# Patient Record
Sex: Female | Born: 1970
Health system: Southern US, Community
[De-identification: ages and names within clinical notes are randomized; demographics above are authoritative.]

## PROBLEM LIST (undated history)

## (undated) DIAGNOSIS — R197 Diarrhea, unspecified: Secondary | ICD-10-CM

## (undated) DIAGNOSIS — R109 Unspecified abdominal pain: Secondary | ICD-10-CM

## (undated) DIAGNOSIS — F329 Major depressive disorder, single episode, unspecified: Secondary | ICD-10-CM

## (undated) DIAGNOSIS — J309 Allergic rhinitis, unspecified: Secondary | ICD-10-CM

## (undated) DIAGNOSIS — R599 Enlarged lymph nodes, unspecified: Secondary | ICD-10-CM

## (undated) DIAGNOSIS — K921 Melena: Secondary | ICD-10-CM

## (undated) DIAGNOSIS — F419 Anxiety disorder, unspecified: Secondary | ICD-10-CM

## (undated) DIAGNOSIS — K644 Residual hemorrhoidal skin tags: Secondary | ICD-10-CM

## (undated) DIAGNOSIS — E785 Hyperlipidemia, unspecified: Secondary | ICD-10-CM

## (undated) DIAGNOSIS — G4700 Insomnia, unspecified: Secondary | ICD-10-CM

## (undated) DIAGNOSIS — T7840XA Allergy, unspecified, initial encounter: Secondary | ICD-10-CM

## (undated) DIAGNOSIS — K649 Unspecified hemorrhoids: Secondary | ICD-10-CM

## (undated) HISTORY — DX: Major depressive disorder, single episode, unspecified: F32.9

## (undated) HISTORY — DX: Melena: K92.1

## (undated) HISTORY — DX: Hyperlipidemia, unspecified: E78.5

## (undated) HISTORY — DX: Insomnia, unspecified: G47.00

## (undated) HISTORY — DX: Enlarged lymph nodes, unspecified: R59.9

## (undated) HISTORY — DX: Diarrhea, unspecified: R19.7

## (undated) HISTORY — DX: Unspecified abdominal pain: R10.9

## (undated) HISTORY — DX: Anxiety disorder, unspecified: F41.9

## (undated) HISTORY — DX: Residual hemorrhoidal skin tags: K64.4

## (undated) HISTORY — DX: Allergic rhinitis, unspecified: J30.9

## (undated) HISTORY — PX: LEEP: SHX91

## (undated) HISTORY — DX: Unspecified hemorrhoids: K64.9

## (undated) HISTORY — DX: Allergy, unspecified, initial encounter: T78.40XA

---

## 2006-08-14 ENCOUNTER — Other Ambulatory Visit: Admission: RE | Admit: 2006-08-14 | Discharge: 2006-08-14 | Payer: Self-pay | Admitting: Gynecology

## 2006-08-21 ENCOUNTER — Ambulatory Visit: Payer: Self-pay | Admitting: Internal Medicine

## 2006-08-21 DIAGNOSIS — F3289 Other specified depressive episodes: Secondary | ICD-10-CM

## 2006-08-21 DIAGNOSIS — F339 Major depressive disorder, recurrent, unspecified: Secondary | ICD-10-CM | POA: Insufficient documentation

## 2006-08-21 DIAGNOSIS — J309 Allergic rhinitis, unspecified: Secondary | ICD-10-CM

## 2006-08-21 DIAGNOSIS — F329 Major depressive disorder, single episode, unspecified: Secondary | ICD-10-CM

## 2006-08-21 HISTORY — DX: Other specified depressive episodes: F32.89

## 2006-08-21 HISTORY — DX: Allergic rhinitis, unspecified: J30.9

## 2006-08-21 HISTORY — DX: Major depressive disorder, single episode, unspecified: F32.9

## 2006-10-07 DIAGNOSIS — O039 Complete or unspecified spontaneous abortion without complication: Secondary | ICD-10-CM | POA: Insufficient documentation

## 2007-11-16 ENCOUNTER — Inpatient Hospital Stay (HOSPITAL_COMMUNITY): Admission: RE | Admit: 2007-11-16 | Discharge: 2007-11-18 | Payer: Self-pay | Admitting: Obstetrics and Gynecology

## 2008-04-29 ENCOUNTER — Telehealth: Payer: Self-pay | Admitting: Internal Medicine

## 2008-05-05 ENCOUNTER — Ambulatory Visit: Payer: Self-pay | Admitting: Internal Medicine

## 2008-05-05 DIAGNOSIS — R109 Unspecified abdominal pain: Secondary | ICD-10-CM

## 2008-05-05 DIAGNOSIS — R1084 Generalized abdominal pain: Secondary | ICD-10-CM | POA: Insufficient documentation

## 2008-05-05 HISTORY — DX: Unspecified abdominal pain: R10.9

## 2009-03-23 ENCOUNTER — Ambulatory Visit: Payer: Self-pay | Admitting: Gastroenterology

## 2009-03-23 DIAGNOSIS — K644 Residual hemorrhoidal skin tags: Secondary | ICD-10-CM

## 2009-03-23 DIAGNOSIS — R197 Diarrhea, unspecified: Secondary | ICD-10-CM

## 2009-03-23 DIAGNOSIS — K649 Unspecified hemorrhoids: Secondary | ICD-10-CM | POA: Insufficient documentation

## 2009-03-23 DIAGNOSIS — R109 Unspecified abdominal pain: Secondary | ICD-10-CM

## 2009-03-23 HISTORY — DX: Diarrhea, unspecified: R19.7

## 2009-03-23 HISTORY — DX: Unspecified hemorrhoids: K64.9

## 2009-03-23 HISTORY — DX: Residual hemorrhoidal skin tags: K64.4

## 2009-03-23 HISTORY — DX: Unspecified abdominal pain: R10.9

## 2009-03-28 ENCOUNTER — Telehealth: Payer: Self-pay | Admitting: Gastroenterology

## 2009-03-28 ENCOUNTER — Encounter: Payer: Self-pay | Admitting: Gastroenterology

## 2009-03-28 LAB — CONVERTED CEMR LAB
AST: 17 units/L (ref 0–37)
Alkaline Phosphatase: 66 units/L (ref 39–117)
BUN: 15 mg/dL (ref 6–23)
Basophils Relative: 0.4 % (ref 0.0–3.0)
Eosinophils Relative: 0.7 % (ref 0.0–5.0)
Glucose, Bld: 82 mg/dL (ref 70–99)
HCT: 35.7 % — ABNORMAL LOW (ref 36.0–46.0)
Lymphs Abs: 1 10*3/uL (ref 0.7–4.0)
MCHC: 34.5 g/dL (ref 30.0–36.0)
MCV: 87.1 fL (ref 78.0–100.0)
Monocytes Absolute: 0.3 10*3/uL (ref 0.1–1.0)
RBC: 4.1 M/uL (ref 3.87–5.11)
Sodium: 140 meq/L (ref 135–145)
Total Bilirubin: 0.7 mg/dL (ref 0.3–1.2)
Total Protein: 7 g/dL (ref 6.0–8.3)
WBC: 4.9 10*3/uL (ref 4.5–10.5)

## 2009-03-30 ENCOUNTER — Encounter: Payer: Self-pay | Admitting: Gastroenterology

## 2009-04-03 ENCOUNTER — Encounter (INDEPENDENT_AMBULATORY_CARE_PROVIDER_SITE_OTHER): Payer: Self-pay

## 2009-06-08 ENCOUNTER — Ambulatory Visit: Payer: Self-pay | Admitting: Family Medicine

## 2009-06-08 DIAGNOSIS — R599 Enlarged lymph nodes, unspecified: Secondary | ICD-10-CM

## 2009-06-08 HISTORY — DX: Enlarged lymph nodes, unspecified: R59.9

## 2009-10-27 ENCOUNTER — Telehealth: Payer: Self-pay | Admitting: Internal Medicine

## 2009-10-31 ENCOUNTER — Encounter: Payer: Self-pay | Admitting: Internal Medicine

## 2010-05-10 ENCOUNTER — Ambulatory Visit: Payer: Self-pay | Admitting: Gastroenterology

## 2010-05-10 DIAGNOSIS — K921 Melena: Secondary | ICD-10-CM | POA: Insufficient documentation

## 2010-05-10 HISTORY — DX: Melena: K92.1

## 2010-06-14 ENCOUNTER — Ambulatory Visit: Payer: Self-pay | Admitting: Gastroenterology

## 2010-06-18 ENCOUNTER — Encounter: Payer: Self-pay | Admitting: Gastroenterology

## 2010-07-26 ENCOUNTER — Ambulatory Visit: Payer: Self-pay | Admitting: Family Medicine

## 2010-07-26 DIAGNOSIS — G4700 Insomnia, unspecified: Secondary | ICD-10-CM | POA: Insufficient documentation

## 2010-07-26 HISTORY — DX: Insomnia, unspecified: G47.00

## 2010-11-06 NOTE — Assessment & Plan Note (Signed)
Summary: med check re: allergy/cjr   Vital Signs:  Patient profile:   40 year old female Weight:      139 pounds Temp:     99.2 degrees F oral BP sitting:   110 / 80  (left arm) Cuff size:   regular  Vitals Entered By: Sid Falcon LPN (July 26, 2010 4:39 PM)  History of Present Illness: Pt here requesting med refills.  Takes Flonase for perennial allergies, ?mold and dust. Controls allergies fairly well.  No side effects.  Has had better response with  nasals vs oral antihistamines.  Hx transient insomnia.  No response with benadryl.  Requests refills  Ambien which she takes infrequently.  no late day caffeine use.  Allergies: 1)  ! Codeine  Past History:  Past Medical History: Last updated: 05/10/2010 Depression Anxiety Disorder Irritable Bowel Syndrome Cholelithiasis PMH reviewed for relevance  Review of Systems  The patient denies anorexia, fever, weight loss, hoarseness, prolonged cough, and headaches.    Physical Exam  General:  Well-developed,well-nourished,in no acute distress; alert,appropriate and cooperative throughout examination Ears:  External ear exam shows no significant lesions or deformities.  Otoscopic examination reveals clear canals, tympanic membranes are intact bilaterally without bulging, retraction, inflammation or discharge. Hearing is grossly normal bilaterally. Nose:  External nasal examination shows no deformity or inflammation. Nasal mucosa are pink and moist without lesions or exudates. Mouth:  Oral mucosa and oropharynx without lesions or exudates.  Teeth in good repair. Neck:  No deformities, masses, or tenderness noted. Lungs:  Normal respiratory effort, chest expands symmetrically. Lungs are clear to auscultation, no crackles or wheezes. Heart:  Normal rate and regular rhythm. S1 and S2 normal without gallop, murmur, click, rub or other extra sounds.   Impression & Recommendations:  Problem # 1:  ALLERGIC  RHINITIS  (ICD-477.9)  Her updated medication list for this problem includes:    Zyrtec Allergy 10 Mg Tabs (Cetirizine hcl) .Marland Kitchen... As needed    Flonase 50 Mcg/act Susp (Fluticasone propionate) .Marland Kitchen... 2 sprays to each nostril daily  Problem # 2:  INSOMNIA, TRANSIENT (ICD-780.52)  Her updated medication list for this problem includes:    Zolpidem Tartrate 10 Mg Tabs (Zolpidem tartrate) ..... One by mouth at bedtime as needed  Complete Medication List: 1)  Zoloft 100 Mg Tabs (Sertraline hcl) .... Take one tab by mouth once daily 2)  Zyrtec Allergy 10 Mg Tabs (Cetirizine hcl) .... As needed 3)  Zolpidem Tartrate 10 Mg Tabs (Zolpidem tartrate) .... One by mouth at bedtime as needed 4)  Flonase 50 Mcg/act Susp (Fluticasone propionate) .... 2 sprays to each nostril daily 5)  Levsin 0.125 Mg Tabs (Hyoscyamine sulfate) .Marland Kitchen.. 1-2 tablets by mouth four times a day as needed Prescriptions: ZOLPIDEM TARTRATE 10 MG TABS (ZOLPIDEM TARTRATE) one by mouth at bedtime as needed  #30 x 1   Entered and Authorized by:   Evelena Peat MD   Signed by:   Evelena Peat MD on 07/26/2010   Method used:   Print then Give to Patient   RxID:   [id number] FLONASE 50 MCG/ACT SUSP (FLUTICASONE PROPIONATE) 2 sprays to each nostril daily  #1 x 11   Entered and Authorized by:   Evelena Peat MD   Signed by:   Evelena Peat MD on 07/26/2010   Method used:   Electronically to        [facility] [address] (retail)       [address]  [place], [place]  [address]       Ph: [phone]       Fax: [phone]   RxID:   [id number]    Orders Added: 1)  Est. Patient Level III [84132]

## 2010-11-06 NOTE — Assessment & Plan Note (Signed)
Summary: ABD PAIN/YF   History of Present Illness Visit Type: Follow-up Visit Primary GI MD: Elie Goody MD [facility] Primary Javion Holmer: Leeanne Deed Requesting Reniya Mcclees: Shaune Spittle Chief Complaint: c/o generalized abdominal pain and 3-4 loose stools per day History of Present Illness:   This is a return office visit for persistent problems with intermittent diarrhea, small-volume hematochezia and mild generalized abdominal pain. Her symptoms have not substantially change from last year, but they persist. She occasionally has nausea and has recently noted some mild postprandial epigastric pain.   GI Review of Systems    Reports abdominal pain and  nausea.     Location of  Abdominal pain: generalized.    Denies acid reflux, belching, bloating, chest pain, dysphagia with liquids, dysphagia with solids, heartburn, loss of appetite, vomiting, vomiting blood, weight loss, and  weight gain.      Reports diarrhea, hemorrhoids, and  rectal bleeding.     Denies anal fissure, black tarry stools, change in bowel habit, constipation, diverticulosis, fecal incontinence, heme positive stool, irritable bowel syndrome, jaundice, light color stool, liver problems, and  rectal pain.   Current Medications (verified): 1)  Zoloft 100 Mg  Tabs (Sertraline Hcl) .... Take One Tab By Mouth Once Daily 2)  Zyrtec Allergy 10 Mg Tabs (Cetirizine Hcl) .... As Needed 3)  Zolpidem Tartrate 10 Mg Tabs (Zolpidem Tartrate) .... One By Mouth At Bedtime As Needed 4)  Flonase 50 Mcg/act Susp (Fluticasone Propionate) .... 2 Sprays To Each Nostril Daily  Allergies (verified): 1)  ! Codeine  Past History:  Past Medical History: Depression Anxiety Disorder Irritable Bowel Syndrome Cholelithiasis  Past Surgical History: Leep procedure  Family History: Reviewed history from 03/23/2009 and no changes required. father alive and well mother alive and well Brain cancer M Grandmother Family History of Heart  Disease: P grandfather No FH of Colon Cancer:  Social History: Reviewed history from 03/23/2009 and no changes required. Occupation: International aid/development worker Married Never Smoked Alcohol use-yes  occ one child Daily Caffeine Use  Review of Systems       The pertinent positives and negatives are noted as above and in the HPI. All other ROS were reviewed and were negative.    Vital Signs:  Patient profile:   40 year old female Height:      66 inches Weight:      135.38 pounds BMI:     21.93 Pulse rate:   84 / minute Pulse rhythm:   regular BP sitting:   110 / 70  (left arm)  Vitals Entered By: Milford Cage NCMA (May 10, 2010 9:30 AM)  Physical Exam  General:  Well developed, well nourished, no acute distress. Head:  Normocephalic and atraumatic. Eyes:  PERRLA, no icterus. Mouth:  No deformity or lesions, dentition normal. Lungs:  Clear throughout to auscultation. Heart:  Regular rate and rhythm; no murmurs, rubs,  or bruits. Abdomen:  Soft, nontender and nondistended. No masses, hepatosplenomegaly or hernias noted. Normal bowel sounds. Rectal:  deferred until time of colonoscopy.   Psych:  Alert and cooperative. Normal mood and affect.  Impression & Recommendations:  Problem # 1:  HEMATOCHEZIA (ICD-578.1) Small-volume hematochezia. Suspected hemorrhoids. Rule out proctitis, inflammatory bowel disease, and colorectal neoplasms. The risks, benefits and alternatives to colonoscopy with possible biopsy, possible destruction of hemorrhoids and possible polypectomy were discussed with the patient and they consent to proceed. The procedure will be scheduled electively. Orders: Colonoscopy (Colon)  Problem # 2:  DIARRHEA (ICD-787.91) Chronic intermittent diarrhea. Suspect  irritable bowel syndrome. Rule out inflammatory bowel disease. Plans as above. Orders: Colonoscopy (Colon)  Problem # 3:  ABDOMINAL PAIN OTHER SPECIFIED SITE (ICD-789.09) Mild generalized abdominal pain,  associated with diarrhea. Rule out IBS, IBD. Trial of Levsin as needed. She also has a component of mild postprandial epigastric pain. If the symptoms worsen, will further evaluate for possible symptomatic cholelithiasis, GERD.  Patient Instructions: 1)  Colonoscopy brochure given.  2)  Pick up prescription from your pharmacy.  3)  Copy sent to : Birdie Sons, MD 4)  The medication list was reviewed and reconciled.  All changed / newly prescribed medications were explained.  A complete medication list was provided to the patient / caregiver.  Prescriptions: LEVSIN 0.125 MG TABS (HYOSCYAMINE SULFATE) 1-2 tablets by mouth four times a day as needed  #60 x 11   Entered by:   Christie Nottingham CMA (AAMA)   Authorized by:   Meryl Dare MD [facility]   Signed by:   Meryl Dare MD FACG on 05/10/2010   Method used:   Electronically to        [facility] [address] (retail)       [address]       [place], [place]  [address]       Ph: [phone]       Fax: [phone]   RxID:   [id number] MOVIPREP 100 GM  SOLR (PEG-KCL-NACL-NASULF-NA ASC-C) As per prep instructions.  #1 x 0   Entered by:   Christie Nottingham CMA (AAMA)   Authorized by:   Meryl Dare MD [facility]   Signed by:   Meryl Dare MD FACG on 05/10/2010   Method used:   Electronically to        [facility] [address] (retail)       [address]       [place], [place]  [address]       Ph: [phone]       Fax: [phone]   RxID:   [id number]

## 2010-11-06 NOTE — Miscellaneous (Signed)
Summary: flu shot   Clinical Lists Changes  Observations: Added new observation of FLU VAX: Historical (10/28/2009 13:40)      Immunization History:  Influenza Immunization History:    Influenza:  historical (10/28/2009) given at [facility] [address]

## 2010-11-06 NOTE — Letter (Signed)
Summary: [facility] Instructions  [facility]  [address] [place], [place] [address]   Phone: [phone]  Fax: [phone]       [name]    05/07/1971    MRN: [id number]        Procedure Day /Date: Thursday September 8th, 2011     Arrival Time: 7:30am     Procedure Time: 8:00am     Location of Procedure:                    _x _  [facility] (4th Floor)                        PREPARATION FOR COLONOSCOPY WITH MOVIPREP   Starting 5 days prior to your procedure 06/09/10 do not eat nuts, seeds, popcorn, corn, beans, peas,  salads, or any raw vegetables.  Do not take any fiber supplements (e.g. Metamucil, Citrucel, and Benefiber).  THE DAY BEFORE YOUR PROCEDURE         DATE: 06/13/10  DAY: Wednesday  1.  Drink clear liquids the entire day-NO SOLID FOOD  2.  Do not drink anything colored red or purple.  Avoid juices with pulp.  No orange juice.  3.  Drink at least 64 oz. (8 glasses) of fluid/clear liquids during the day to prevent dehydration and help the prep work efficiently.  CLEAR LIQUIDS INCLUDE: Water Jello Ice Popsicles Tea (sugar ok, no milk/cream) Powdered fruit flavored drinks Coffee (sugar ok, no milk/cream) Gatorade Juice: apple, white grape, white cranberry  Lemonade Clear bullion, consomm, broth Carbonated beverages (any kind) Strained chicken noodle soup Hard Candy                             4.  In the morning, mix first dose of MoviPrep solution:    Empty 1 Pouch A and 1 Pouch B into the disposable container    Add lukewarm drinking water to the top line of the container. Mix to dissolve    Refrigerate (mixed solution should be used within 24 hrs)  5.  Begin drinking the prep at 5:00 p.m. The MoviPrep container is divided by 4 marks.   Every 15 minutes drink the solution down to the next mark (approximately 8 oz) until the full liter is complete.   6.  Follow completed prep with 16 oz of clear liquid of your  choice (Nothing red or purple).  Continue to drink clear liquids until bedtime.  7.  Before going to bed, mix second dose of MoviPrep solution:    Empty 1 Pouch A and 1 Pouch B into the disposable container    Add lukewarm drinking water to the top line of the container. Mix to dissolve    Refrigerate  THE DAY OF YOUR PROCEDURE      DATE: 06/14/10 DAY: Thursday  Beginning at 3:30 a.m. (5 hours before procedure):         1. Every 15 minutes, drink the solution down to the next mark (approx 8 oz) until the full liter is complete.  2. Follow completed prep with 16 oz. of clear liquid of your choice.    3. You may drink clear liquids until 6:30am (2 HOURS BEFORE PROCEDURE).   MEDICATION INSTRUCTIONS  Unless otherwise instructed, you should take regular prescription medications with a small sip of water   as early as possible the morning of your  procedure.        OTHER INSTRUCTIONS  You will need a responsible adult at least 40 years of age to accompany you and drive you home.   This person must remain in the waiting room during your procedure.  Wear loose fitting clothing that is easily removed.  Leave jewelry and other valuables at home.  However, you may wish to bring a book to read or  an iPod/MP3 player to listen to music as you wait for your procedure to start.  Remove all body piercing jewelry and leave at home.  Total time from sign-in until discharge is approximately 2-3 hours.  You should go home directly after your procedure and rest.  You can resume normal activities the  day after your procedure.  The day of your procedure you should not:   Drive   Make legal decisions   Operate machinery   Drink alcohol   Return to work  You will receive specific instructions about eating, activities and medications before you leave.    The above instructions have been reviewed and explained to me by   Marchelle Folks.     I fully understand and can verbalize these  instructions _____________________________ Date _________

## 2010-11-06 NOTE — Procedures (Signed)
Summary: Colonoscopy  Patient: [name] Note: All result statuses are Final unless otherwise noted.  Tests: (1) Colonoscopy (COL)   COL Colonoscopy           DONE (C)     [facility]     [address]     [place], [place]  [address]           COLONOSCOPY PROCEDURE REPORT           PATIENT:  [name], [name]  MR#:  [id number]     BIRTHDATE:  [birth year], 39 yrs. old  GENDER:  female     ENDOSCOPIST:  Judie Petit T. Russella Dar, MD, [facility]     Referred by:  Kathleen Argue, M.D.     PROCEDURE DATE:  06/14/2010     PROCEDURE:  Colonoscopy with biopsy     ASA CLASS:  Class I     INDICATIONS:  1) hematochezia  2) unexplained diarrhea 3)     abdominal pain     MEDICATIONS:   Fentanyl 100 mcg IV, Versed 10 mg IV     DESCRIPTION OF PROCEDURE:   After the risks benefits and     alternatives of the procedure were thoroughly explained, informed     consent was obtained.  Digital rectal exam was performed and     revealed no abnormalities.   The LB [id number]-[id number] endoscope     was introduced through the anus and advanced to the terminal ileum     which was intubated for a short distance, without limitations.     The quality of the prep was good, using MoviPrep.  The instrument     was then slowly withdrawn as the colon was fully examined.     <<PROCEDUREIMAGES>>     FINDINGS:  A normal appearing cecum, ileocecal valve, and     appendiceal orifice were identified. The ascending, hepatic     flexure, transverse, splenic flexure, descending, sigmoid colon,     and rectum appeared unremarkable. Random biopsies were obtained     and sent to pathology.  The terminal ileum appeared normal.     Retroflexed views in the rectum revealed internal hemorrhoids,     small. The time to cecum =  1.67  minutes. The scope was then     withdrawn (time =  9  min) from the patient and the procedure     completed.     COMPLICATIONS:  None           ENDOSCOPIC IMPRESSION:     1) Normal colon     2)  Normal terminal ileum     3) Internal hemorrhoids           RECOMMENDATIONS:     1) Await patholoty results     2) Call office to schedule followup visit in 6-8 weeks     3) Continue antispasmotics for presumed IBS     4) OTC Prep H supp qd prn           Daneka Lantigua T. Russella Dar, MD, [facility]           n.     REVISED:  06/14/2010 09:17 AM     eSIGNED:   Venita Lick. Rachana Malesky at 06/14/2010 09:17 AM           Sindy Messing, [id number]  Note: An exclamation mark (!) indicates a result that was not dispersed into the flowsheet. Document Creation Date: 06/14/2010 9:18 AM _______________________________________________________________________  (1) Order result  status: Final Collection or observation date-time: 06/14/2010 08:59 Requested date-time:  Receipt date-time:  Reported date-time:  Referring Physician:   Ordering Physician: Claudette Head [phone]) Specimen Source:  Source: Launa Grill Order Number: [id number] Lab site:   Appended Document: Colonoscopy     Procedures Next Due Date:    Colonoscopy: 02/2021

## 2010-11-06 NOTE — Progress Notes (Signed)
Summary: refill  Phone Note Refill Request Message from:  Fax from Pharmacy  Refills Requested: Medication #1:  FLONASE 50 MCG/ACT SUSP 2 sprays to each nostril daily.   Brand Name Necessary? No   Last Refilled: 06/09/2009 [address] [phone]   fax----[phone]  Initial call taken by: Warnell Forester,  October 27, 2009 3:23 PM    Prescriptions: Aleda Grana 50 MCG/ACT SUSP (FLUTICASONE PROPIONATE) 2 sprays to each nostril daily  #1 x 0   Entered by:   Kern Reap CMA (AAMA)   Authorized by:   Birdie Sons MD   Signed by:   Kern Reap CMA (AAMA) on 10/27/2009   Method used:   Electronically to        [facility] [address] (retail)       [address]       [place], [place]  [address]       Ph: [phone]       Fax: [phone]   RxID:   [id number]

## 2010-11-06 NOTE — Letter (Signed)
Summary: Patient Notice- Colon Biospy Results  [facility]  [address] [place], [place] [address]   Phone: [phone]  Fax: [phone]        June 18, 2010 MRN: [id number]    [name] [address] [place], [place]  [address]    Dear Ms. Henard,  I am pleased to inform you that the biopsies taken during your recent colonoscopy did not show any evidence of cancer upon pathologic examination. 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  Continue with the treatment plan as outlined on the day of your      exam.  You should have a repeat colonoscopy examination for screening at age 37.  Please call us if you are having persistent problems or have questions about your condition that have not been fully answered at this time.  Sincerely,  Meryl Dare MD [facility]  This letter has been electronically signed by your physician.  Appended Document: Patient Notice- Colon Biospy Results letter mailed

## 2011-02-19 NOTE — Discharge Summary (Signed)
NAMEJAHZARA, [name]             ACCOUNT NO.:  [id number]   MEDICAL RECORD NO.:  [id number]          PATIENT TYPE:  INP   LOCATION:  9134                          FACILITY:  [facility]   PHYSICIAN:  Huel Cote, M.D. DATE OF BIRTH:  10/02/1971   DATE OF ADMISSION:  11/16/2007  DATE OF DISCHARGE:  11/18/2007                               DISCHARGE SUMMARY   DISCHARGE DIAGNOSES:  1. Term pregnancy at 39 weeks delivered.  2. Status post normal spontaneous vaginal delivery   DISCHARGE MEDICATIONS:  1. Motrin 600 mg p.o. every 6 hours.  2. Zoloft 100 mg p.o. daily.   HOSPITAL COURSE:  The patient is 40 year old G2, P 0-0-1-0 who was  admitted at 39-2/[redacted] weeks gestation for induction of labor given term  status and some borderline blood pressures over the last 8 weeks.  PIH  labs had remained normal and the patient, other than some generalized  edema, had no symptoms.  Prenatal care was uneventful except for a  advanced maternal age with normal first trimester screen.  The patient  had a history of LEEP and had some scarring of her cervix noted on exam.   PRENATAL LABS:  Prenatal labs are as follows:n  A positive, antibody  negative, rubella immune, RPR nonreactive, hepatitis B surface antigen  negative, HIV negative, GC and Chlamydia negative, group B strep  negative, 1-hour Glucola 113.   OB HISTORY:  She had a history of miscarriage x1.   GYN HISTORY:  history of a LEEP laser of her cervix  x2.  No recent  abnormal Pap smears.   PAST MEDICAL HISTORY:  Anxiety and depression stable on Zoloft and oral  HSV.   PAST SURGICAL HISTORY:  None.   ALLERGIES:  CODEINE.   MEDICATIONS:  Zoloft, Zantac and prenatal vitamins.   PHYSICAL EXAMINATION:  VITAL SIGNS:  On admission she was 132/92.  Fetal  heart rate reactive.  ABDOMEN:  Abdomen was gravid and nontender.  Estimated fetal weight was  7-1/2 to 8 pounds.  PELVIC:  Cervix was 91 and -1 station with some scarring of the  cervical  os felt.   LABORATORY DATA:  PIH labs were normal.   HOSPITAL COURSE:  She was placed on Pitocin and had rupture of membranes  performed with clear fluid noted.  She progressed well throughout the  day, reached complete dilation and pushed for approximately 2 hours with  normal spontaneous vaginal delivery of a viable female infant over a  second-degree laceration.  Apgars were 6 and 9, weight was 7 pounds 15  ounces.  There was a terminal meconium noted.  The baby was slightly  floppy but responded well and became vigorous after suctioning.  Secondary laceration was repaired with 2-0 Vicryl.  Sphincter was  reinforced secondary to stretching with the same suture.  Placenta was  delivered spontaneously. Estimated blood loss 400 mL.  Cervix and rectum  were intact.  On postpartum day #1, she was doing fine and was somewhat  sore.  Hemoglobin was stable at 9.4.  By postpartum day #2, her  shortness was improving and stable on p.o.  Motrin and she was felt  stable for discharge home.      Huel Cote, M.D.  Electronically Signed     KR/MEDQ  D:  11/18/2007  T:  11/19/2007  Job:  [id number]

## 2011-06-28 ENCOUNTER — Other Ambulatory Visit: Payer: Self-pay | Admitting: Family Medicine

## 2011-06-28 LAB — URIC ACID: Uric Acid, Serum: 5.2

## 2011-06-28 LAB — CBC
HCT: 32.3 — ABNORMAL LOW
Hemoglobin: 11.1 — ABNORMAL LOW
MCHC: 34.2
MCHC: 34.4
MCV: 85.9
MCV: 86.3
Platelets: 155
RBC: 3.77 — ABNORMAL LOW
WBC: 14.4 — ABNORMAL HIGH

## 2011-06-28 LAB — CCBB MATERNAL DONOR DRAW

## 2011-06-28 LAB — LACTATE DEHYDROGENASE: LDH: 137

## 2011-06-28 LAB — COMPREHENSIVE METABOLIC PANEL
AST: 28
CO2: 21
Chloride: 102
Creatinine, Ser: 0.76
GFR calc Af Amer: >60
GFR calc non Af Amer: >60
Glucose, Bld: 131 — ABNORMAL HIGH
Total Bilirubin: 0.6

## 2011-07-01 NOTE — Telephone Encounter (Signed)
A Dr Cato Mulligan pt, will route to his nurse

## 2011-07-04 NOTE — Telephone Encounter (Signed)
Pt needs OV then we will call in rx for [employer]

## 2011-07-04 NOTE — Telephone Encounter (Signed)
Spoke to pt said she needed to come in for an office visit. Pt did not schedule office visit at this time but is aware in order to get a refill she will need to see the Dr. Tiajuana Amass.

## 2011-12-12 ENCOUNTER — Ambulatory Visit (INDEPENDENT_AMBULATORY_CARE_PROVIDER_SITE_OTHER): Payer: BC Managed Care – PPO | Admitting: Family Medicine

## 2011-12-12 ENCOUNTER — Encounter: Payer: Self-pay | Admitting: Family Medicine

## 2011-12-12 VITALS — BP 120/88 | HR 72 | Temp 98.0°F | Resp 12 | Ht 65.75 in | Wt 136.0 lb

## 2011-12-12 DIAGNOSIS — G4700 Insomnia, unspecified: Secondary | ICD-10-CM

## 2011-12-12 DIAGNOSIS — J309 Allergic rhinitis, unspecified: Secondary | ICD-10-CM

## 2011-12-12 DIAGNOSIS — B001 Herpesviral vesicular dermatitis: Secondary | ICD-10-CM

## 2011-12-12 DIAGNOSIS — B009 Herpesviral infection, unspecified: Secondary | ICD-10-CM

## 2011-12-12 MED ORDER — VALACYCLOVIR HCL 1 G PO TABS: ORAL_TABLET | ORAL | Status: DC

## 2011-12-12 MED ORDER — ZOLPIDEM TARTRATE 10 MG PO TABS: 10.0000 mg | ORAL_TABLET | Freq: Every evening | ORAL | Status: DC | PRN

## 2011-12-12 MED ORDER — FLUTICASONE PROPIONATE 50 MCG/ACT NA SUSP: 2.0000 | Freq: Every day | NASAL | Status: DC

## 2011-12-12 NOTE — Patient Instructions (Signed)
Valtrex take 2 at onset of cold sore and repeat 2 12 hours later

## 2011-12-12 NOTE — Progress Notes (Signed)
  Subjective:    Patient ID: [name], female    DOB: 07/20/1971, 41 y.o.   MRN: [id number]  HPI  Medical followup. Patient has history of recurrent cold sores and requesting prescription for Valtrex which has worked well in the past.  Usually several flare ups per year. She has seasonal allergies and uses Flonase and requesting refills. Mostly spring allergies. No current symptoms. Also has history of transient insomnia. Takes Ambien usually about one day per week. No side effects. No regular ETOH.  Past Medical History  Diagnosis Date  . DEPRESSION 08/21/2006  . HEMORRHOIDS-EXTERNAL 03/23/2009  . HEMORRHOIDS 03/23/2009  . ALLERGIC  RHINITIS 08/21/2006  . HEMATOCHEZIA 05/10/2010  . INSOMNIA, TRANSIENT 07/26/2010  . CERVICAL LYMPHADENOPATHY 06/08/2009  . Diarrhea 03/23/2009  . ABDOMINAL PAIN 05/05/2008  . ABDOMINAL PAIN OTHER SPECIFIED SITE 03/23/2009   Past Surgical History  Procedure Date  . Leep     reports that she has never smoked. She does not have any smokeless tobacco history on file. Her alcohol and drug histories not on file. family history includes Cancer in her other and Heart disease in her other. Allergies  Allergen Reactions  . Codeine     GI upset, nausea      Review of Systems  Constitutional: Negative for fever and chills.  HENT: Positive for congestion. Negative for nosebleeds.   Respiratory: Negative for cough and shortness of breath.   Cardiovascular: Negative for chest pain.  Neurological: Negative for headaches.  Hematological: Negative for adenopathy.  Psychiatric/Behavioral: Negative for dysphoric mood.       Objective:   Physical Exam  Constitutional: She appears well-developed and well-nourished.  HENT:  Right Ear: External ear normal.  Left Ear: External ear normal.  Mouth/Throat: Oropharynx is clear and moist.  Neck: Neck supple.  Cardiovascular: Normal rate and regular rhythm.   No murmur heard. Pulmonary/Chest: Effort normal and  breath sounds normal. No respiratory distress. She has no wheezes. She has no rales.  Lymphadenopathy:    She has no cervical adenopathy.          Assessment & Plan:  #1 recurrent cold sores. Valtrex 1 g 2 tablets at onset and 2 tablets 12 hours later #2 seasonal allergies. Refill Flonase for one year #3 transient insomnia. Refill Ambien for rare when necessary use

## 2012-03-04 ENCOUNTER — Other Ambulatory Visit: Payer: Self-pay | Admitting: Family Medicine

## 2012-03-05 NOTE — Telephone Encounter (Signed)
may refill for 3 months 

## 2012-03-05 NOTE — Telephone Encounter (Signed)
done

## 2012-03-05 NOTE — Telephone Encounter (Signed)
Last seen 12/12/11 - cold sores and insomnia Last written 12/12/11 # 30 1RF Please advise

## 2012-05-27 ENCOUNTER — Other Ambulatory Visit (HOSPITAL_COMMUNITY): Payer: Self-pay | Admitting: Obstetrics and Gynecology

## 2012-05-27 DIAGNOSIS — Z1231 Encounter for screening mammogram for malignant neoplasm of breast: Secondary | ICD-10-CM

## 2012-05-28 ENCOUNTER — Ambulatory Visit (HOSPITAL_COMMUNITY)
Admission: RE | Admit: 2012-05-28 | Discharge: 2012-05-28 | Disposition: A | Discharge status: Home / Self Care | Payer: BC Managed Care – PPO | Source: Ambulatory Visit | Attending: Obstetrics and Gynecology | Admitting: Obstetrics and Gynecology

## 2012-05-28 DIAGNOSIS — Z1231 Encounter for screening mammogram for malignant neoplasm of breast: Secondary | ICD-10-CM | POA: Insufficient documentation

## 2012-06-04 ENCOUNTER — Other Ambulatory Visit: Payer: Self-pay | Admitting: Obstetrics and Gynecology

## 2012-06-04 DIAGNOSIS — R928 Other abnormal and inconclusive findings on diagnostic imaging of breast: Secondary | ICD-10-CM

## 2012-06-05 ENCOUNTER — Other Ambulatory Visit: Payer: Self-pay | Admitting: Obstetrics and Gynecology

## 2012-06-05 DIAGNOSIS — R928 Other abnormal and inconclusive findings on diagnostic imaging of breast: Secondary | ICD-10-CM

## 2012-06-11 ENCOUNTER — Ambulatory Visit
Admission: RE | Admit: 2012-06-11 | Discharge: 2012-06-11 | Disposition: A | Discharge status: Home / Self Care | Payer: BC Managed Care – PPO | Source: Ambulatory Visit | Attending: Obstetrics and Gynecology | Admitting: Obstetrics and Gynecology

## 2012-06-11 DIAGNOSIS — R928 Other abnormal and inconclusive findings on diagnostic imaging of breast: Secondary | ICD-10-CM

## 2012-06-24 ENCOUNTER — Other Ambulatory Visit: Payer: Self-pay | Admitting: Family Medicine

## 2012-07-01 ENCOUNTER — Encounter (HOSPITAL_COMMUNITY): Payer: Self-pay | Admitting: Adult Health

## 2012-07-01 ENCOUNTER — Emergency Department (HOSPITAL_COMMUNITY)
Admission: EM | Admit: 2012-07-01 | Discharge: 2012-07-01 | Disposition: A | Discharge status: Home / Self Care | Payer: Worker's Compensation | Attending: Emergency Medicine | Admitting: Emergency Medicine

## 2012-07-01 DIAGNOSIS — Z203 Contact with and (suspected) exposure to rabies: Secondary | ICD-10-CM

## 2012-07-01 DIAGNOSIS — Z886 Allergy status to analgesic agent status: Secondary | ICD-10-CM | POA: Insufficient documentation

## 2012-07-01 DIAGNOSIS — Z23 Encounter for immunization: Secondary | ICD-10-CM | POA: Insufficient documentation

## 2012-07-01 DIAGNOSIS — F3289 Other specified depressive episodes: Secondary | ICD-10-CM | POA: Insufficient documentation

## 2012-07-01 DIAGNOSIS — F329 Major depressive disorder, single episode, unspecified: Secondary | ICD-10-CM | POA: Insufficient documentation

## 2012-07-01 MED ORDER — RABIES VACCINE, PCEC IM SUSR
1.0000 mL | Freq: Once | INTRAMUSCULAR | Status: AC
  Administered 2012-07-01: 1 mL via INTRAMUSCULAR
  Filled 2012-07-01: qty 1

## 2012-07-01 NOTE — ED Provider Notes (Signed)
History  This chart was scribed for Glynn Octave, MD by Ardeen Jourdain. This patient was seen in room TR06C/TR06C and the patient's care was started at 1516.  CSN: [id number]  Arrival date & time 07/01/12  1516   None     Chief Complaint  Patient presents with  . Rabies Injection     The history is provided by the patient. No language interpreter was used.   [name] is a 41 y.o. female who presents to the Emergency Department complaining of possible exposure to rabies, but denies being bitten. She denies fever, neck pain, sore throat, visual disturbance, CP, cough, SOB, abdominal pain, nausea, emesis, diarrhea, urinary symptoms, back pain, HA, weakness, numbness and rash as associated symptoms. She states that the last exposure was 14 days ago. 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 She states that the CDC recommends she starts the rabies series at this time. She denies any past medical history.        Past Medical History  Diagnosis Date  . DEPRESSION 08/21/2006  . HEMORRHOIDS-EXTERNAL 03/23/2009  . HEMORRHOIDS 03/23/2009  . ALLERGIC  RHINITIS 08/21/2006  . HEMATOCHEZIA 05/10/2010  . INSOMNIA, TRANSIENT 07/26/2010  . CERVICAL LYMPHADENOPATHY 06/08/2009  . Diarrhea 03/23/2009  . ABDOMINAL PAIN 05/05/2008  . ABDOMINAL PAIN OTHER SPECIFIED SITE 03/23/2009    Past Surgical History  Procedure Date  . Leep     Family History  Problem Relation Age of Onset  . Cancer Other     brain, grandmother  . Heart disease Other     History  Substance Use Topics  . Smoking status: Never Smoker   . Smokeless tobacco: Not on file  . Alcohol Use: Not on file   No OB history available.   Review of Systems  A complete 10 system review of systems was obtained and all systems are negative except as noted in the HPI and PMH.    Allergies   Codeine  Home Medications   Current Outpatient Rx  Name Route Sig Dispense Refill  . FLUTICASONE PROPIONATE 50 MCG/ACT NA SUSP Nasal Place 2 sprays into the nose daily. 16 g 11  . SERTRALINE HCL 100 MG PO TABS Oral Take 100 mg by mouth daily.    Marland Kitchen VALACYCLOVIR HCL 1 G PO TABS  USE AS DIRECTED AS NEEDED FOR COLD SORES 30 tablet 2  . ZOLPIDEM TARTRATE 10 MG PO TABS  TAKE ONE TABLET BY MOUTH AT BEDTIME AS NEEDED 30 tablet 2    Authorization is required for next refill.    Triage Vitals: BP 118/81  Pulse 71  Temp 98 F (36.7 C)  Resp 16  SpO2 100%  Physical Exam  Nursing note and vitals reviewed. Constitutional: She is oriented to person, place, and time. She appears well-developed and well-nourished. No distress.  HENT:  Head: Normocephalic and atraumatic.  Eyes: EOM are normal.  Neck: Normal range of motion. Neck supple. No tracheal deviation present.  Cardiovascular: Normal rate, regular rhythm and normal heart sounds.   Pulmonary/Chest: Effort normal and breath sounds normal. No respiratory distress.  Musculoskeletal: Normal range of motion.  Neurological: She is alert and oriented to person, place, and time.  Skin: Skin is warm and dry.  Psychiatric: She has a normal mood and affect. Her behavior is normal.    ED Course  Procedures (including critical care time)  DIAGNOSTIC STUDIES: Oxygen Saturation is 100% on room air, normal by my interpretation.    COORDINATION OF CARE:  1536- Discussed treatment plan with pt at bedside and pt agreed to plan. Pt will receive 1st and 3rd days of rabies series, she has received the full series in the past. She states that she has here titers checked regularly and have always been normal.   16:00-Medication Orders: Rabies vaccine, PCEC (Rabavert) injection 1 mL-once  Labs Reviewed - No data to display No results found.   No diagnosis found.    MDM  Asymptomatic exposure to possible rabies. Employee of a [employer] operational [employer]  where an ill dog tested inconclusively.  CDC recommends treatment. Has had previous rabies vaccination.  Will give 2 dose booster.  2nd Dose on day 3. D/w poison center.     I personally performed the services described in this documentation, which was scribed in my presence.  The recorded information has been reviewed and considered.   Glynn Octave, MD 07/01/12 [phone]

## 2012-07-01 NOTE — ED Notes (Signed)
Pt d/c home in NAD. Pt voiced understanding of d/c instructions and follow up care. Pt not c/o pain in injection site, no swelling or redness noted.

## 2012-07-01 NOTE — ED Notes (Signed)
Pt states she has had rabies series 15 years prior.

## 2012-07-01 NOTE — ED Notes (Signed)
2 weeks ago treated dog with inconclusive rabies test. CDC requested they come here for rabies injections. No bite.

## 2012-07-03 NOTE — ED Notes (Signed)
Copy of injection record faxed to [facility] and pharmacy

## 2012-07-04 ENCOUNTER — Encounter (HOSPITAL_COMMUNITY): Payer: Self-pay | Admitting: Emergency Medicine

## 2012-07-04 ENCOUNTER — Emergency Department (INDEPENDENT_AMBULATORY_CARE_PROVIDER_SITE_OTHER)
Admission: EM | Admit: 2012-07-04 | Discharge: 2012-07-04 | Disposition: A | Discharge status: Home / Self Care | Payer: Worker's Compensation | Source: Home / Self Care

## 2012-07-04 DIAGNOSIS — Z23 Encounter for immunization: Secondary | ICD-10-CM

## 2012-07-04 MED ORDER — RABIES VACCINE, PCEC IM SUSR
1.0000 mL | Freq: Once | INTRAMUSCULAR | Status: AC
  Administered 2012-07-04: 1 mL via INTRAMUSCULAR

## 2012-07-04 MED ORDER — RABIES VACCINE, PCEC IM SUSR
INTRAMUSCULAR | Status: AC
  Filled 2012-07-04: qty 1

## 2012-07-04 NOTE — ED Notes (Signed)
Pt is here for 2nd dose of rabies vaccine.

## 2012-10-30 ENCOUNTER — Other Ambulatory Visit: Payer: Self-pay | Admitting: Family Medicine

## 2012-11-10 ENCOUNTER — Other Ambulatory Visit: Payer: Self-pay | Admitting: Obstetrics and Gynecology

## 2012-11-10 DIAGNOSIS — R921 Mammographic calcification found on diagnostic imaging of breast: Secondary | ICD-10-CM

## 2012-12-17 ENCOUNTER — Ambulatory Visit
Admission: RE | Admit: 2012-12-17 | Discharge: 2012-12-17 | Disposition: A | Discharge status: Home / Self Care | Payer: Worker's Compensation | Source: Ambulatory Visit | Attending: Obstetrics and Gynecology | Admitting: Obstetrics and Gynecology

## 2012-12-17 DIAGNOSIS — R921 Mammographic calcification found on diagnostic imaging of breast: Secondary | ICD-10-CM

## 2013-07-22 ENCOUNTER — Ambulatory Visit (INDEPENDENT_AMBULATORY_CARE_PROVIDER_SITE_OTHER): Payer: 59 | Admitting: Family Medicine

## 2013-07-22 ENCOUNTER — Other Ambulatory Visit: Payer: Self-pay

## 2013-07-22 ENCOUNTER — Encounter: Payer: Self-pay | Admitting: Family Medicine

## 2013-07-22 VITALS — BP 124/70 | HR 75 | Temp 97.9°F | Wt 140.0 lb

## 2013-07-22 DIAGNOSIS — B001 Herpesviral vesicular dermatitis: Secondary | ICD-10-CM

## 2013-07-22 DIAGNOSIS — Z23 Encounter for immunization: Secondary | ICD-10-CM

## 2013-07-22 DIAGNOSIS — D229 Melanocytic nevi, unspecified: Secondary | ICD-10-CM

## 2013-07-22 DIAGNOSIS — G4700 Insomnia, unspecified: Secondary | ICD-10-CM

## 2013-07-22 DIAGNOSIS — D239 Other benign neoplasm of skin, unspecified: Secondary | ICD-10-CM

## 2013-07-22 DIAGNOSIS — B009 Herpesviral infection, unspecified: Secondary | ICD-10-CM

## 2013-07-22 MED ORDER — ZOLPIDEM TARTRATE 10 MG PO TABS: 10.0000 mg | ORAL_TABLET | Freq: Every evening | ORAL | Status: DC | PRN

## 2013-07-22 MED ORDER — VALACYCLOVIR HCL 1 G PO TABS: ORAL_TABLET | ORAL | Status: DC

## 2013-07-22 NOTE — Progress Notes (Signed)
  Subjective:    Patient ID: [name], female    DOB: March 19, 1971, 42 y.o.   MRN: [id number]  HPI Patient here for the following issues Requesting mole check. She's had previous dysplastic nevus on the foot. She has never had any cancerous lesions. Her mother had basal cell cancer.  She also needs Ambien refill for intermittent insomnia. She does not take this regularly. Requesting refill Valtrex for cold sores. Denies late day use of caffeine. No consistent alcohol use  Generally very healthy. She sees gynecologist regularly. Requesting flu vaccine  Past Medical History  Diagnosis Date  . DEPRESSION 08/21/2006  . HEMORRHOIDS-EXTERNAL 03/23/2009  . HEMORRHOIDS 03/23/2009  . ALLERGIC  RHINITIS 08/21/2006  . HEMATOCHEZIA 05/10/2010  . INSOMNIA, TRANSIENT 07/26/2010  . CERVICAL LYMPHADENOPATHY 06/08/2009  . Diarrhea 03/23/2009  . ABDOMINAL PAIN 05/05/2008  . ABDOMINAL PAIN OTHER SPECIFIED SITE 03/23/2009   Past Surgical History  Procedure Laterality Date  . Leep      reports that she has never smoked. She does not have any smokeless tobacco history on file. Her alcohol and drug histories are not on file. family history includes Cancer in her other; Heart disease in her other. Allergies  Allergen Reactions  . Codeine Nausea And Vomiting      Review of Systems  Constitutional: Negative for fever, appetite change and unexpected weight change.  Respiratory: Negative for cough and shortness of breath.   Cardiovascular: Negative for chest pain.  Skin: Negative for rash.  Hematological: Negative for adenopathy.       Objective:   Physical Exam  Constitutional: She appears well-developed and well-nourished.  Cardiovascular: Normal rate and regular rhythm.   Pulmonary/Chest: Effort normal and breath sounds normal. No respiratory distress. She has no wheezes. She has no rales.  Skin: No rash noted.  Patient has some scattered benign-appearing nevi and angiomas. No concerning  lesions. She was examined with nurse present in the room and we examined her basically from head to toe.  Psychiatric: She has a normal mood and affect. Her behavior is normal.          Assessment & Plan:  #1 benign nevi. Reassurance.  Reviewed ABCDs of moles and melanomas #2 transient insomnia. Sleep hygiene discussed. Refill Ambien for as needed use #3 intermittent cold sores. Refill Valtrex for as needed use

## 2013-07-22 NOTE — Patient Instructions (Signed)
Moles  Moles are usually harmless growths on the skin. They are accumulations of color (pigment) cells in the skin that:    Can be various colors, from light brown to black.   Can appear anywhere on the body.   May remain flat or become raised.   May contain hairs.   May remain smooth or develop wrinkling.  Most moles are not cancerous (benign). However, some moles may develop changes and become cancerous. It is important to check your moles every month. If you check your moles regularly, you will be able to notice any changes that may occur.   CAUSES   Moles occur when skin cells grow together in clusters instead of spreading out in the skin as they normally do. The reason for this clustering is unknown.  DIAGNOSIS   Your caregiver will perform a skin examination to diagnose your mole.   TREATMENT   Moles usually do not require treatment. If a mole becomes worrisome, your caregiver may choose to take a sample of the mole or remove it entirely, and then send it to a lab for examination.   HOME CARE INSTRUCTIONS   Check your mole(s) monthly for changes that may indicate skin cancer. These changes can include:   A change in size.   A change in color. Note that moles tend to darken during pregnancy or when taking birth control pills (oral contraception).   A change in shape.   A change in the border of the mole.   Wear sunscreen (with an SPF of at least 30) when you spend long periods of time outside. Reapply the sunscreen every 2 3 hours.   Schedule annual appointments with your skin doctor (dermatologist) if you have a large number of moles.  SEEK MEDICAL CARE IF:   Your mole changes size, especially if it becomes larger than a pencil eraser.   Your mole changes in color or develops more than one color.   Your mole becomes itchy or bleeds.   Your mole, or the skin near the mole, becomes painful, sore, red, or swollen.   Your mole becomes scaly, sheds skin, or oozes fluid.    Your mole develops irregular borders.   Your mole becomes flat or develops raised areas.   Your mole becomes hard or soft.  Document Released: 06/18/2001 Document Revised: 06/17/2012 Document Reviewed: 04/06/2012  Exit[employer] Patient Information 2014 [employer].

## 2013-09-20 ENCOUNTER — Other Ambulatory Visit: Payer: Self-pay | Admitting: Obstetrics and Gynecology

## 2013-09-20 DIAGNOSIS — R921 Mammographic calcification found on diagnostic imaging of breast: Secondary | ICD-10-CM

## 2013-10-14 ENCOUNTER — Ambulatory Visit
Admission: RE | Admit: 2013-10-14 | Discharge: 2013-10-14 | Disposition: A | Discharge status: Home / Self Care | Payer: 59 | Source: Ambulatory Visit | Attending: Obstetrics and Gynecology | Admitting: Obstetrics and Gynecology

## 2013-10-14 DIAGNOSIS — R921 Mammographic calcification found on diagnostic imaging of breast: Secondary | ICD-10-CM

## 2013-12-02 ENCOUNTER — Telehealth: Payer: Self-pay | Admitting: Family Medicine

## 2013-12-02 NOTE — Telephone Encounter (Signed)
[place] [address] requesting refill of zolpidem (AMBIEN) 10 MG tablet

## 2013-12-03 NOTE — Telephone Encounter (Signed)
Pt stated that she does not a refill she has one refill left

## 2014-02-01 ENCOUNTER — Ambulatory Visit (INDEPENDENT_AMBULATORY_CARE_PROVIDER_SITE_OTHER): Payer: 59 | Admitting: Family Medicine

## 2014-02-01 ENCOUNTER — Encounter: Payer: Self-pay | Admitting: Family Medicine

## 2014-02-01 VITALS — BP 110/80 | HR 74 | Temp 98.2°F | Ht 65.75 in | Wt 134.0 lb

## 2014-02-01 DIAGNOSIS — J329 Chronic sinusitis, unspecified: Secondary | ICD-10-CM

## 2014-02-01 DIAGNOSIS — J309 Allergic rhinitis, unspecified: Secondary | ICD-10-CM

## 2014-02-01 DIAGNOSIS — R42 Dizziness and giddiness: Secondary | ICD-10-CM

## 2014-02-01 DIAGNOSIS — R1310 Dysphagia, unspecified: Secondary | ICD-10-CM

## 2014-02-01 DIAGNOSIS — H6590 Unspecified nonsuppurative otitis media, unspecified ear: Secondary | ICD-10-CM

## 2014-02-01 MED ORDER — AMOXICILLIN 875 MG PO TABS: 875.0000 mg | ORAL_TABLET | Freq: Two times a day (BID) | ORAL | Status: DC

## 2014-02-01 NOTE — Patient Instructions (Signed)
-  As we discussed, we have prescribed a new medication for you at this appointment. We discussed the common and serious potential adverse effects of this medication and you can review these and more with the pharmacist when you pick up your medication.  Please follow the instructions for use carefully and notify us immediately if you have any problems taking this medication.  -afrin for 4 days  -if allergies not improving see allergist  -We placed a referral for you as discussed to the gastroenterologist for your issues with swallowing. It usually takes about 1-2 weeks to process and schedule this referral. If you have not heard from [place] regarding this appointment in 2 weeks please contact our office.  -follow up in 3-4 weeks and as needed

## 2014-02-01 NOTE — Progress Notes (Signed)
Pre visit review using our clinic review tool, if applicable. No additional management support is needed unless otherwise documented below in the visit note. 

## 2014-02-01 NOTE — Progress Notes (Signed)
No chief complaint on file.   HPI:  Acute visit:  Dysphagia: -started 6 weeks ago -feels like food and pills get stuck in throat in neck, globus sensation - has seen GI in the past for IBS and abd pain, has been several years -hx of GERD as a child, took famotidine and maybe got a little better -wants to see GI  Vertigo: -a little dizzy at work today, chronic issues with mild dizziness - did vertigo maneuvers and now gone - twins sister gets positional vertigo really bad  Allergies/Sinus issues: -has had cold symptoms for 3 weeks with nasal congestion, ear fullness, PND, tooth pain  -doing nettie pot, nasocort and zyrtec and not working -denies: fevers, chills, PND, SOB, ear pain, sinus pain -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  Past Medical History  Diagnosis Date  . DEPRESSION 08/21/2006  . HEMORRHOIDS-EXTERNAL 03/23/2009  . HEMORRHOIDS 03/23/2009  . ALLERGIC  RHINITIS 08/21/2006  . HEMATOCHEZIA 05/10/2010  . INSOMNIA, TRANSIENT 07/26/2010  . CERVICAL LYMPHADENOPATHY 06/08/2009  . Diarrhea 03/23/2009  . ABDOMINAL PAIN 05/05/2008  . ABDOMINAL PAIN OTHER SPECIFIED SITE 03/23/2009    Past Surgical History  Procedure Laterality Date  . Leep      Family History  Problem Relation Age of Onset  . Cancer Other     brain, grandmother  . Heart disease Other     History   Social History  . Marital Status: Married    Spouse Name: [name]    Number of Children: [name]  . Years of Education: [name]   Social History Main Topics  . Smoking status: Never Smoker   . Smokeless tobacco: None  . Alcohol Use: None  . Drug Use: None  . Sexual Activity: None   Other Topics Concern  . None   Social History Narrative  . None    Current outpatient prescriptions:fluticasone (FLONASE) 50 MCG/ACT nasal spray, Place 2 sprays into the nose daily as needed. For seasonal allergies, Disp: , Rfl: ;  sertraline (ZOLOFT) 100 MG tablet, Take 100 mg by mouth  daily., Disp: , Rfl: ;  valACYclovir (VALTREX) 1000 MG tablet, USE AS DIRECTED AS NEEDED FOR COLD SORES, Disp: 30 tablet, Rfl: 2 zolpidem (AMBIEN) 10 MG tablet, Take 1 tablet (10 mg total) by mouth at bedtime as needed. For sleep, Disp: 30 tablet, Rfl: 1;  amoxicillin (AMOXIL) 875 MG tablet, Take 1 tablet (875 mg total) by mouth 2 (two) times daily., Disp: 20 tablet, Rfl: 0  EXAM:  Filed Vitals:   02/01/14 1347  BP: 110/80  Pulse: 74  Temp: 98.2 F (36.8 C)    Body mass index is 21.79 kg/(m^2).  GENERAL: vitals reviewed and listed above, alert, oriented, appears well hydrated and in no acute distress  HEENT: atraumatic, conjunttiva clear, no obvious abnormalities on inspection of external nose and ears, normal appearance of ear canals and TMs except for bilat clear effusion, white nasal congestion L > W, mild post oropharyngeal erythema with PND, no tonsillar edema or exudate, no sinus TTP  NECK: no obvious masses on inspection  LUNGS: clear to auscultation bilaterally, no wheezes, rales or rhonchi, good air movement  CV: HRRR, no peripheral edema  MS: moves all extremities without noticeable abnormality  PSYCH: pleasant and cooperative, no obvious depression or anxiety  NEURO: CN II-XII grossly intact except known findings in blind L eye, finger to nose normal, gait normal, dix halpike normal  ASSESSMENT AND PLAN:  Discussed the  following assessment and plan:  Sinusitis - Plan: amoxicillin (AMOXIL) 875 MG tablet -potentially bacterial and abx given for this, afrin for 4 days  ALLERGIC  RHINITIS -she will consider seeing allergist  Dysphagia -referred to GI  Middle ear effusion -afrin, INS, antihistamine  Dizziness -we discussed possible serious and likely etiologies, workup and treatment, treatment risks and return precautions -potentially related to allergy and sinus issues, possibly positional, resolved after manuveurs -after this discussion, [name] opted for  observation, tx  Sinus and allergy issues -follow up advised in 3-4 weeks -of course, we advised [name]  to return or notify a doctor immediately if symptoms worsen or persist or new concerns arise.  -Patient advised to return or notify a doctor immediately if symptoms worsen or persist or new concerns arise.  Patient Instructions  -As we discussed, we have prescribed a new medication for you at this appointment. We discussed the common and serious potential adverse effects of this medication and you can review these and more with the pharmacist when you pick up your medication.  Please follow the instructions for use carefully and notify us immediately if you have any problems taking this medication.  -afrin for 4 days  -if allergies not improving see allergist  -We placed a referral for you as discussed to the gastroenterologist for your issues with swallowing. It usually takes about 1-2 weeks to process and schedule this referral. If you have not heard from [place] regarding this appointment in 2 weeks please contact our office.  -follow up in 3-4 weeks and as needed     [name]

## 2014-02-28 ENCOUNTER — Other Ambulatory Visit: Payer: Self-pay | Admitting: Family Medicine

## 2014-03-10 ENCOUNTER — Ambulatory Visit: Payer: 59 | Admitting: Family Medicine

## 2014-03-11 ENCOUNTER — Ambulatory Visit (INDEPENDENT_AMBULATORY_CARE_PROVIDER_SITE_OTHER): Payer: 59 | Admitting: Family Medicine

## 2014-03-11 ENCOUNTER — Encounter: Payer: Self-pay | Admitting: Family Medicine

## 2014-03-11 VITALS — BP 110/80 | HR 89 | Temp 97.6°F | Wt 127.0 lb

## 2014-03-11 DIAGNOSIS — J309 Allergic rhinitis, unspecified: Secondary | ICD-10-CM

## 2014-03-11 DIAGNOSIS — J3089 Other allergic rhinitis: Secondary | ICD-10-CM

## 2014-03-11 MED ORDER — VALACYCLOVIR HCL 1 G PO TABS: ORAL_TABLET | ORAL | Status: DC

## 2014-03-11 MED ORDER — AZELASTINE HCL 0.1 % NA SOLN: 2.0000 | Freq: Two times a day (BID) | NASAL | Status: DC

## 2014-03-11 NOTE — Progress Notes (Signed)
   Subjective:    Patient ID: [name], female    DOB: 1970-10-16, 43 y.o.   MRN: [id number]  Sinus Problem Associated symptoms include congestion. Pertinent negatives include no chills, ear pain, headaches or sore throat.   2 month history of sinus drainage. She has history of known allergies. She takes Zyrtec and Nasacort though somewhat inconsistently. She's had some postnasal drainage. No cough. Daily congestion. No headaches. No fevers or chills. No purulent drainage. Denies any facial pain.  Past Medical History  Diagnosis Date  . DEPRESSION 08/21/2006  . HEMORRHOIDS-EXTERNAL 03/23/2009  . HEMORRHOIDS 03/23/2009  . ALLERGIC  RHINITIS 08/21/2006  . HEMATOCHEZIA 05/10/2010  . INSOMNIA, TRANSIENT 07/26/2010  . CERVICAL LYMPHADENOPATHY 06/08/2009  . Diarrhea 03/23/2009  . ABDOMINAL PAIN 05/05/2008  . ABDOMINAL PAIN OTHER SPECIFIED SITE 03/23/2009   Past Surgical History  Procedure Laterality Date  . Leep      reports that she has never smoked. She does not have any smokeless tobacco history on file. Her alcohol and drug histories are not on file. family history includes Cancer in her other; Heart disease in her other. Allergies  Allergen Reactions  . Codeine Nausea And Vomiting      Review of Systems  Constitutional: Negative for fever and chills.  HENT: Positive for congestion and postnasal drip. Negative for ear pain and sore throat.   Neurological: Negative for headaches.       Objective:   Physical Exam  Constitutional: She appears well-developed and well-nourished.  HENT:  Right Ear: External ear normal.  Left Ear: External ear normal.  Nose: Nose normal.  Mouth/Throat: Oropharynx is clear and moist.  Cardiovascular: Normal rate and regular rhythm.   Pulmonary/Chest: Effort normal and breath sounds normal. No respiratory distress. She has no wheezes. She has no rales.          Assessment & Plan:  Perennial allergic rhinitis. Continue Zyrtec. Continue  Nasacort. Add Astelin nasal. Consider Singulair if not responding to above.

## 2014-03-11 NOTE — Progress Notes (Signed)
Pre visit review using our clinic review tool, if applicable. No additional management support is needed unless otherwise documented below in the visit note. 

## 2014-03-11 NOTE — Patient Instructions (Signed)
Allergic Rhinitis Allergic rhinitis is when the mucous membranes in the nose respond to allergens. Allergens are particles in the air that cause your body to have an allergic reaction. This causes you to release allergic antibodies. Through a chain of events, these eventually cause you to release histamine into the blood stream. Although meant to protect the body, it is this release of histamine that causes your discomfort, such as frequent sneezing, congestion, and an itchy, runny nose.  CAUSES  Seasonal allergic rhinitis (hay fever) is caused by pollen allergens that may come from grasses, trees, and weeds. Year-round allergic rhinitis (perennial allergic rhinitis) is caused by allergens such as house dust mites, pet dander, and mold spores.  SYMPTOMS   Nasal stuffiness (congestion).  Itchy, runny nose with sneezing and tearing of the eyes. DIAGNOSIS  Your health care provider can help you determine the allergen or allergens that trigger your symptoms. If you and your health care provider are unable to determine the allergen, skin or blood testing may be used. TREATMENT  Allergic Rhinitis does not have a cure, but it can be controlled by:  Medicines and allergy shots (immunotherapy).  Avoiding the allergen. Hay fever may often be treated with antihistamines in pill or nasal spray forms. Antihistamines block the effects of histamine. There are over-the-counter medicines that may help with nasal congestion and swelling around the eyes. Check with your health care provider before taking or giving this medicine.  If avoiding the allergen or the medicine prescribed do not work, there are many new medicines your health care provider can prescribe. Stronger medicine may be used if initial measures are ineffective. Desensitizing injections can be used if medicine and avoidance does not work. Desensitization is when a patient is given ongoing shots until the body becomes less sensitive to the allergen.  Make sure you follow up with your health care provider if problems continue. HOME CARE INSTRUCTIONS It is not possible to completely avoid allergens, but you can reduce your symptoms by taking steps to limit your exposure to them. It helps to know exactly what you are allergic to so that you can avoid your specific triggers. SEEK MEDICAL CARE IF:   You have a fever.  You develop a cough that does not stop easily (persistent).  You have shortness of breath.  You start wheezing.  Symptoms interfere with normal daily activities. Document Released: 06/18/2001 Document Revised: 07/14/2013 Document Reviewed: 05/31/2013 [employer] Patient Information 2014 [employer].  

## 2014-03-14 ENCOUNTER — Telehealth: Payer: Self-pay | Admitting: Family Medicine

## 2014-03-14 MED ORDER — MONTELUKAST SODIUM 10 MG PO TABS: 10.0000 mg | ORAL_TABLET | Freq: Every day | ORAL | Status: DC

## 2014-03-14 NOTE — Telephone Encounter (Signed)
Pt called to say that the rx for azelastine (ASTELIN) 0.1 % nasal spray was to expensive and would to know if Dr Elease Hashimoto can call her in Singulair

## 2014-03-14 NOTE — Telephone Encounter (Signed)
singulair 10 mg once daily.  #30 with 6 refills.

## 2014-03-14 NOTE — Telephone Encounter (Signed)
Rx sent to pharmacy and pt is aware. 

## 2014-06-17 ENCOUNTER — Telehealth: Payer: Self-pay | Admitting: Family Medicine

## 2014-06-17 MED ORDER — ZOLPIDEM TARTRATE 10 MG PO TABS: 10.0000 mg | ORAL_TABLET | Freq: Every evening | ORAL | Status: DC | PRN

## 2014-06-17 NOTE — Telephone Encounter (Signed)
Refill OK

## 2014-06-17 NOTE — Telephone Encounter (Signed)
[name] is requesting re-fill on zolpidem (AMBIEN) 10 MG tablet

## 2014-06-17 NOTE — Telephone Encounter (Signed)
RX called into pharmacy

## 2014-06-17 NOTE — Telephone Encounter (Signed)
Last visit 03/11/14 Last refill 07/22/13 #30 1 refill

## 2014-11-04 ENCOUNTER — Other Ambulatory Visit: Payer: Self-pay | Admitting: Family Medicine

## 2014-12-08 ENCOUNTER — Ambulatory Visit (INDEPENDENT_AMBULATORY_CARE_PROVIDER_SITE_OTHER): Payer: 59 | Admitting: Family Medicine

## 2014-12-08 ENCOUNTER — Encounter: Payer: Self-pay | Admitting: Family Medicine

## 2014-12-08 VITALS — BP 114/80 | HR 72 | Temp 97.7°F | Ht 65.0 in | Wt 131.0 lb

## 2014-12-08 DIAGNOSIS — Z0000 Encounter for general adult medical examination without abnormal findings: Secondary | ICD-10-CM

## 2014-12-08 MED ORDER — VALACYCLOVIR HCL 1 G PO TABS: ORAL_TABLET | ORAL | Status: DC

## 2014-12-08 MED ORDER — ZOLPIDEM TARTRATE 10 MG PO TABS: 10.0000 mg | ORAL_TABLET | Freq: Every evening | ORAL | Status: DC | PRN

## 2014-12-08 NOTE — Progress Notes (Signed)
   Subjective:    Patient ID: [name], female    DOB: 08-09-1971, 44 y.o.   MRN: [id number]  HPI Patient seen for complete physical. Generally very healthy. She has history of depression and has follow-up psychiatry. She had divorced last year and things have settled quite a bit for her this year. She works as a Clinical biochemist. Nonsmoker. Last tetanus estimated 7 years ago. She sees GYN regularly. She has history of recurrent cold sores and requesting Valtrex. She has history of insomnia and uses Ambien as needed. Requesting refills. She is also followed by dermatologist once yearly  Past Medical History  Diagnosis Date  . DEPRESSION 08/21/2006  . HEMORRHOIDS-EXTERNAL 03/23/2009  . HEMORRHOIDS 03/23/2009  . ALLERGIC  RHINITIS 08/21/2006  . HEMATOCHEZIA 05/10/2010  . INSOMNIA, TRANSIENT 07/26/2010  . CERVICAL LYMPHADENOPATHY 06/08/2009  . Diarrhea 03/23/2009  . ABDOMINAL PAIN 05/05/2008  . ABDOMINAL PAIN OTHER SPECIFIED SITE 03/23/2009   Past Surgical History  Procedure Laterality Date  . Leep      reports that she has never smoked. She does not have any smokeless tobacco history on file. Her alcohol and drug histories are not on file. family history includes Cancer in her other; Heart disease in her other. Allergies  Allergen Reactions  . Codeine Nausea And Vomiting      Review of Systems  Constitutional: Negative for fever, activity change, appetite change, fatigue and unexpected weight change.  HENT: Negative for ear pain, hearing loss, sore throat and trouble swallowing.   Eyes: Negative for visual disturbance.  Respiratory: Negative for cough and shortness of breath.   Cardiovascular: Negative for chest pain and palpitations.  Gastrointestinal: Negative for abdominal pain, diarrhea, constipation and blood in stool.  Endocrine: Negative for polydipsia and polyuria.  Genitourinary: Negative for dysuria and hematuria.  Musculoskeletal: Negative for myalgias, back pain  and arthralgias.  Skin: Negative for rash.  Neurological: Negative for dizziness, syncope and headaches.  Hematological: Negative for adenopathy.  Psychiatric/Behavioral: Positive for sleep disturbance. Negative for confusion and dysphoric mood.       Objective:   Physical Exam  Constitutional: She is oriented to person, place, and time. She appears well-developed and well-nourished.  HENT:  Head: Normocephalic and atraumatic.  Eyes: EOM are normal. Pupils are equal, round, and reactive to light.  Neck: Normal range of motion. Neck supple. No thyromegaly present.  Cardiovascular: Normal rate, regular rhythm and normal heart sounds.   No murmur heard. Pulmonary/Chest: Breath sounds normal. No respiratory distress. She has no wheezes. She has no rales.  Abdominal: Soft. Bowel sounds are normal. She exhibits no distension and no mass. There is no tenderness. There is no rebound and no guarding.  Genitourinary:  Per GYN  Musculoskeletal: Normal range of motion. She exhibits no edema.  Lymphadenopathy:    She has no cervical adenopathy.  Neurological: She is alert and oriented to person, place, and time. She displays normal reflexes. No cranial nerve deficit.  Skin: No rash noted.  Psychiatric: She has a normal mood and affect. Her behavior is normal. Judgment and thought content normal.          Assessment & Plan:  Complete physical. Patient declines lab work. Continue regular exercise habits. She'll continue with regular GYN follow-up. Confirm date of last tetanus which she estimates at less than 10 years. Recommend yearly flu vaccine. Refills of Valtrex and Ambien given

## 2014-12-08 NOTE — Progress Notes (Signed)
Pre visit review using our clinic review tool, if applicable. No additional management support is needed unless otherwise documented below in the visit note. 

## 2015-04-21 ENCOUNTER — Encounter: Payer: Self-pay | Admitting: Gastroenterology

## 2015-05-10 ENCOUNTER — Other Ambulatory Visit: Payer: Self-pay | Admitting: Family Medicine

## 2015-11-08 ENCOUNTER — Encounter: Payer: Self-pay | Admitting: Family Medicine

## 2015-11-08 ENCOUNTER — Ambulatory Visit (INDEPENDENT_AMBULATORY_CARE_PROVIDER_SITE_OTHER): Payer: 59 | Admitting: Family Medicine

## 2015-11-08 VITALS — BP 100/80 | HR 91 | Temp 98.3°F | Ht 65.0 in | Wt 136.0 lb

## 2015-11-08 DIAGNOSIS — G4700 Insomnia, unspecified: Secondary | ICD-10-CM

## 2015-11-08 MED ORDER — ZOLPIDEM TARTRATE 10 MG PO TABS: 10.0000 mg | ORAL_TABLET | Freq: Every evening | ORAL | Status: DC | PRN

## 2015-11-08 NOTE — Progress Notes (Signed)
   Subjective:    Patient ID: [name], female    DOB: Dec 14, 1970, 45 y.o.   MRN: [id number]  HPI  patient here for follow-up regarding chronic intermittent insomnia. She takes Ambien but usually only once or twice per week. We have discussed sleep hygiene in past. She does not use alcohol regularly. She had previously tried over-the-counter medications without much success. She denies any side effects.   She takes sertraline per her GYN for premenstrual dysphoric syndrome. She is getting regular physicals through her gynecologist.  Past Medical History  Diagnosis Date  . DEPRESSION 08/21/2006  . HEMORRHOIDS-EXTERNAL 03/23/2009  . HEMORRHOIDS 03/23/2009  . ALLERGIC  RHINITIS 08/21/2006  . HEMATOCHEZIA 05/10/2010  . INSOMNIA, TRANSIENT 07/26/2010  . CERVICAL LYMPHADENOPATHY 06/08/2009  . Diarrhea 03/23/2009  . ABDOMINAL PAIN 05/05/2008  . ABDOMINAL PAIN OTHER SPECIFIED SITE 03/23/2009   Past Surgical History  Procedure Laterality Date  . Leep      reports that she has never smoked. She does not have any smokeless tobacco history on file. Her alcohol and drug histories are not on file. family history includes Cancer in her other; Heart disease in her other. Allergies  Allergen Reactions  . Codeine Nausea And Vomiting      Review of Systems  Constitutional: Negative for fever and chills.  Respiratory: Negative for shortness of breath.   Cardiovascular: Negative for chest pain.       Objective:   Physical Exam  Neck: Neck supple. No thyromegaly present.  Cardiovascular: Normal rate and regular rhythm.   No murmur heard. Pulmonary/Chest: Effort normal and breath sounds normal. No respiratory distress. She has no wheezes. She has no rales.  Musculoskeletal: She exhibits no edema.          Assessment & Plan:   Chronic intermittent insomnia. Refill Ambien for as needed use. She is encouraged not uses regularly. She is getting her physicals through GYN.

## 2016-08-15 LAB — HM MAMMOGRAPHY: HM Mammogram: NORMAL (ref 0–4)

## 2016-08-15 LAB — HM PAP SMEAR

## 2016-08-16 ENCOUNTER — Encounter: Payer: Self-pay | Admitting: Family Medicine

## 2016-09-24 ENCOUNTER — Ambulatory Visit (INDEPENDENT_AMBULATORY_CARE_PROVIDER_SITE_OTHER): Payer: 59 | Admitting: Family Medicine

## 2016-09-24 ENCOUNTER — Encounter: Payer: Self-pay | Admitting: Family Medicine

## 2016-09-24 VITALS — BP 110/80 | HR 104 | Temp 98.1°F | Ht 65.0 in | Wt 140.0 lb

## 2016-09-24 DIAGNOSIS — Z23 Encounter for immunization: Secondary | ICD-10-CM | POA: Diagnosis not present

## 2016-09-24 DIAGNOSIS — Z0000 Encounter for general adult medical examination without abnormal findings: Secondary | ICD-10-CM | POA: Diagnosis not present

## 2016-09-24 LAB — BASIC METABOLIC PANEL
BUN: 22 mg/dL (ref 6–23)
CHLORIDE: 103 meq/L (ref 96–112)
CO2: 27 mEq/L (ref 19–32)
Calcium: 9.4 mg/dL (ref 8.4–10.5)
Creatinine, Ser: 0.75 mg/dL (ref 0.40–1.20)
GFR: 88.58 mL/min (ref >60.00)
Glucose, Bld: 86 mg/dL (ref 70–99)
Potassium: 3.8 mEq/L (ref 3.5–5.1)
Sodium: 138 mEq/L (ref 135–145)

## 2016-09-24 LAB — HEPATIC FUNCTION PANEL
ALK PHOS: 54 U/L (ref 39–117)
ALT: 12 U/L (ref 0–35)
AST: 15 U/L (ref 0–37)
Albumin: 4.5 g/dL (ref 3.5–5.2)
BILIRUBIN TOTAL: 0.3 mg/dL (ref 0.2–1.2)
Bilirubin, Direct: 0.1 mg/dL (ref 0.0–0.3)
Total Protein: 7.2 g/dL (ref 6.0–8.3)

## 2016-09-24 LAB — CBC WITH DIFFERENTIAL/PLATELET
BASOS ABS: 0 10*3/uL (ref 0.0–0.1)
Basophils Relative: 0.3 % (ref 0.0–3.0)
Eosinophils Absolute: 0.1 10*3/uL (ref 0.0–0.7)
Eosinophils Relative: 1.2 % (ref 0.0–5.0)
HCT: 37.3 % (ref 36.0–46.0)
HEMOGLOBIN: 12.6 g/dL (ref 12.0–15.0)
LYMPHS ABS: 1.7 10*3/uL (ref 0.7–4.0)
Lymphocytes Relative: 21.4 % (ref 12.0–46.0)
MCHC: 33.9 g/dL (ref 30.0–36.0)
MCV: 87.4 fl (ref 78.0–100.0)
MONO ABS: 0.5 10*3/uL (ref 0.1–1.0)
Monocytes Relative: 6.3 % (ref 3.0–12.0)
NEUTROS PCT: 70.8 % (ref 43.0–77.0)
Neutro Abs: 5.5 10*3/uL (ref 1.4–7.7)
Platelets: 292 10*3/uL (ref 150.0–400.0)
RBC: 4.27 Mil/uL (ref 3.87–5.11)
RDW: 14.6 % (ref 11.5–15.5)
WBC: 7.7 10*3/uL (ref 4.0–10.5)

## 2016-09-24 LAB — TSH: TSH: 0.84 u[IU]/mL (ref 0.35–4.50)

## 2016-09-24 LAB — LIPID PANEL
CHOL/HDL RATIO: 4
Cholesterol: 236 mg/dL — ABNORMAL HIGH (ref 0–200)
HDL: 64.7 mg/dL (ref >39.00)
LDL CALC: 157 mg/dL — AB (ref 0–99)
NONHDL: 171.78
Triglycerides: 76 mg/dL (ref 0.0–149.0)
VLDL: 15.2 mg/dL (ref 0.0–40.0)

## 2016-09-24 MED ORDER — SERTRALINE HCL 100 MG PO TABS: 100.0000 mg | ORAL_TABLET | Freq: Every day | ORAL | 3 refills | Status: DC

## 2016-09-24 MED ORDER — ZOLPIDEM TARTRATE 10 MG PO TABS: 10.0000 mg | ORAL_TABLET | Freq: Every evening | ORAL | 1 refills | Status: DC | PRN

## 2016-09-24 MED ORDER — VALACYCLOVIR HCL 1 G PO TABS: 2000.0000 mg | ORAL_TABLET | Freq: Two times a day (BID) | ORAL | 1 refills | Status: DC

## 2016-09-24 NOTE — Progress Notes (Signed)
Pre visit review using our clinic review tool, if applicable. No additional management support is needed unless otherwise documented below in the visit note. 

## 2016-09-24 NOTE — Progress Notes (Signed)
Subjective:     Patient ID: [name], female   DOB: 08/27/1971, 45 y.o.   MRN: [id number]  HPI Patient seen for complete physical. She sees gynecologist yearly. She has history of recurrent cold sores and uses Valtrex as needed. She has long-standing history of anxiety and is on daily sertraline 100 mg Nonsmoker. Exercises a few days per week. Tetanus is due. She already had flu vaccination. She had Pap smear in November. She has occasional insomnia and infrequently uses Ambien.    Past Medical History:  Diagnosis Date  . ABDOMINAL PAIN 05/05/2008  . ABDOMINAL PAIN OTHER SPECIFIED SITE 03/23/2009  . ALLERGIC  RHINITIS 08/21/2006  . CERVICAL LYMPHADENOPATHY 06/08/2009  . DEPRESSION 08/21/2006  . Diarrhea 03/23/2009  . HEMATOCHEZIA 05/10/2010  . HEMORRHOIDS 03/23/2009  . HEMORRHOIDS-EXTERNAL 03/23/2009  . INSOMNIA, TRANSIENT 07/26/2010   Past Surgical History:  Procedure Laterality Date  . LEEP      reports that she has never smoked. She has never used smokeless tobacco. She reports that she drinks alcohol. She reports that she does not use drugs. family history includes Cancer in her other; Heart disease in her other. Allergies  Allergen Reactions  . Codeine Nausea And Vomiting     Review of Systems  Constitutional: Negative for activity change, appetite change, fatigue, fever and unexpected weight change.  HENT: Negative for ear pain, hearing loss, sore throat and trouble swallowing.   Eyes: Negative for visual disturbance.  Respiratory: Negative for cough and shortness of breath.   Cardiovascular: Negative for chest pain and palpitations.  Gastrointestinal: Negative for abdominal pain, blood in stool, constipation and diarrhea.  Genitourinary: Negative for dysuria and hematuria.  Musculoskeletal: Negative for arthralgias, back pain and myalgias.  Skin: Negative for rash.  Neurological: Negative for dizziness, syncope and headaches.  Hematological: Negative for adenopathy.   Psychiatric/Behavioral: Negative for confusion and dysphoric mood.       Objective:   Physical Exam  Constitutional: She is oriented to person, place, and time. She appears well-developed and well-nourished.  HENT:  Head: Normocephalic and atraumatic.  Eyes: EOM are normal. Pupils are equal, round, and reactive to light.  Neck: Normal range of motion. Neck supple. No thyromegaly present.  Cardiovascular: Normal rate, regular rhythm and normal heart sounds.   No murmur heard. Pulmonary/Chest: Breath sounds normal. No respiratory distress. She has no wheezes. She has no rales.  Abdominal: Soft. Bowel sounds are normal. She exhibits no distension and no mass. There is no tenderness. There is no rebound and no guarding.  Musculoskeletal: Normal range of motion. She exhibits no edema.  Lymphadenopathy:    She has no cervical adenopathy.  Neurological: She is alert and oriented to person, place, and time. She displays normal reflexes. No cranial nerve deficit.  Skin: No rash noted.  Psychiatric: She has a normal mood and affect. Her behavior is normal. Judgment and thought content normal.       Assessment:     Physical exam. Patient needs tetanus booster.  No recent screening labs.    Plan:     -Tdap -Check screening lab work -She will continue with regular GYN follow-up -Continue regular weightbearing exercise -Refilled Valtrex and Zoloft for one year -refill Ambien for as needed use.  Eulas Post MD [facility]

## 2016-09-25 ENCOUNTER — Telehealth: Payer: Self-pay | Admitting: Family Medicine

## 2016-09-25 NOTE — Telephone Encounter (Addendum)
Pt is returning autumn call concerning blood work

## 2016-09-25 NOTE — Telephone Encounter (Signed)
Please see lab results annotation.

## 2017-06-05 DIAGNOSIS — L814 Other melanin hyperpigmentation: Secondary | ICD-10-CM | POA: Diagnosis not present

## 2017-06-05 DIAGNOSIS — D1801 Hemangioma of skin and subcutaneous tissue: Secondary | ICD-10-CM | POA: Diagnosis not present

## 2017-06-05 DIAGNOSIS — L821 Other seborrheic keratosis: Secondary | ICD-10-CM | POA: Diagnosis not present

## 2017-08-14 DIAGNOSIS — Z23 Encounter for immunization: Secondary | ICD-10-CM | POA: Diagnosis not present

## 2017-08-21 DIAGNOSIS — Z01419 Encounter for gynecological examination (general) (routine) without abnormal findings: Secondary | ICD-10-CM | POA: Diagnosis not present

## 2017-08-21 DIAGNOSIS — Z1231 Encounter for screening mammogram for malignant neoplasm of breast: Secondary | ICD-10-CM | POA: Diagnosis not present

## 2017-09-04 ENCOUNTER — Other Ambulatory Visit: Payer: Self-pay | Admitting: Obstetrics and Gynecology

## 2017-09-04 DIAGNOSIS — R928 Other abnormal and inconclusive findings on diagnostic imaging of breast: Secondary | ICD-10-CM

## 2017-09-09 ENCOUNTER — Ambulatory Visit (INDEPENDENT_AMBULATORY_CARE_PROVIDER_SITE_OTHER): Payer: 59 | Admitting: Family Medicine

## 2017-09-09 ENCOUNTER — Encounter: Payer: Self-pay | Admitting: Family Medicine

## 2017-09-09 VITALS — BP 108/78 | HR 88 | Temp 98.1°F | Ht 65.0 in | Wt 140.9 lb

## 2017-09-09 DIAGNOSIS — F339 Major depressive disorder, recurrent, unspecified: Secondary | ICD-10-CM

## 2017-09-09 DIAGNOSIS — Z0000 Encounter for general adult medical examination without abnormal findings: Secondary | ICD-10-CM

## 2017-09-09 MED ORDER — SERTRALINE HCL 100 MG PO TABS: 100.0000 mg | ORAL_TABLET | Freq: Every day | ORAL | 3 refills | Status: DC

## 2017-09-09 NOTE — Progress Notes (Signed)
Subjective:     Patient ID: [name], female   DOB: 08-26-1971, 46 y.o.   MRN: [id number]  HPI Patient seen for physical exam. She sees gynecologist yearly and saw them recently. Generally very healthy. She exercises several days per week. 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 Nonsmoker. Tetanus up-to-date. Flu vaccine already given.  History of recurrent depression which is stable on sertraline. She is requesting refills.  Past Medical History:  Diagnosis Date  . ABDOMINAL PAIN 05/05/2008  . ABDOMINAL PAIN OTHER SPECIFIED SITE 03/23/2009  . ALLERGIC  RHINITIS 08/21/2006  . CERVICAL LYMPHADENOPATHY 06/08/2009  . DEPRESSION 08/21/2006  . Diarrhea 03/23/2009  . HEMATOCHEZIA 05/10/2010  . HEMORRHOIDS 03/23/2009  . HEMORRHOIDS-EXTERNAL 03/23/2009  . INSOMNIA, TRANSIENT 07/26/2010   Past Surgical History:  Procedure Laterality Date  . LEEP      reports that  has never smoked. she has never used smokeless tobacco. She reports that she drinks alcohol. She reports that she does not use drugs. family history includes Cancer in her other; Heart disease in her other. Allergies  Allergen Reactions  . Codeine Nausea And Vomiting       Review of Systems  Constitutional: Negative for activity change, appetite change, fatigue, fever and unexpected weight change.  HENT: Negative for ear pain, hearing loss, sore throat and trouble swallowing.   Eyes: Negative for visual disturbance.  Respiratory: Negative for cough and shortness of breath.   Cardiovascular: Negative for chest pain and palpitations.  Gastrointestinal: Negative for abdominal pain, blood in stool, constipation and diarrhea.  Genitourinary: Negative for dysuria and hematuria.  Musculoskeletal: Negative for arthralgias, back pain and myalgias.  Skin: Negative for rash.  Neurological: Negative for dizziness, syncope and headaches.  Hematological: Negative for adenopathy.  Psychiatric/Behavioral: Negative for  confusion and dysphoric mood.       Objective:   Physical Exam  Constitutional: She is oriented to person, place, and time. She appears well-developed and well-nourished.  HENT:  Head: Normocephalic and atraumatic.  Eyes: EOM are normal. Pupils are equal, round, and reactive to light.  Neck: Normal range of motion. Neck supple. No thyromegaly present.  Cardiovascular: Normal rate, regular rhythm and normal heart sounds.  No murmur heard. Pulmonary/Chest: Breath sounds normal. No respiratory distress. She has no wheezes. She has no rales.  Abdominal: Soft. Bowel sounds are normal. She exhibits no distension and no mass. There is no tenderness. There is no rebound and no guarding.  Genitourinary:  Genitourinary Comments: Per gyn  Musculoskeletal: Normal range of motion. She exhibits no edema.  Lymphadenopathy:    She has no cervical adenopathy.  Neurological: She is alert and oriented to person, place, and time. She displays normal reflexes. No cranial nerve deficit.  Skin: No rash noted.  Psychiatric: She has a normal mood and affect. Her behavior is normal. Judgment and thought content normal.       Assessment:     Physical exam. Generally healthy 46 year old female. Following issues were addressed    Plan:     -She will continue GYN follow-up regarding Pap smears and mammograms -Refilled sertraline for one year -Continue regular exercise habits -Discuss new [employer] guidelines regarding colonoscopy screening. She actually had previous colonoscopy 7 years ago. Consider follow-up at age 54  Eulas Post MD [facility]

## 2017-09-11 ENCOUNTER — Ambulatory Visit: Payer: 59

## 2017-09-11 ENCOUNTER — Ambulatory Visit
Admission: RE | Admit: 2017-09-11 | Discharge: 2017-09-11 | Disposition: A | Discharge status: Home / Self Care | Payer: 59 | Source: Ambulatory Visit | Attending: Obstetrics and Gynecology | Admitting: Obstetrics and Gynecology

## 2017-09-11 DIAGNOSIS — R922 Inconclusive mammogram: Secondary | ICD-10-CM | POA: Diagnosis not present

## 2017-09-11 DIAGNOSIS — R928 Other abnormal and inconclusive findings on diagnostic imaging of breast: Secondary | ICD-10-CM

## 2017-10-22 ENCOUNTER — Telehealth: Payer: Self-pay | Admitting: Family Medicine

## 2017-10-22 NOTE — Telephone Encounter (Signed)
Copied from [place] [id number]. Topic: General - Other >> Oct 22, 2017 10:25 AM Yvette Rack wrote: Reason for CRM: patient calling because she need something stronger that Ambien to help her sleep she states that her boyfriend had committed suicide and she can't sleep she states that she can't keep taking the Ambien everyday please send RX to [facility], [place] [phone] (Phone) [phone] (Fax)

## 2017-10-23 NOTE — Telephone Encounter (Signed)
Lets send in Restoril 15 mg 1-2 po qhs prn insomnia  #15 with no refills.  Offer follow up to discuss if we can help in any way.

## 2017-10-24 MED ORDER — TEMAZEPAM 15 MG PO CAPS
15.0000 mg | ORAL_CAPSULE | Freq: Every evening | ORAL | 0 refills | Status: DC | PRN
Start: 2017-10-24 — End: 2018-02-26

## 2017-10-24 NOTE — Telephone Encounter (Signed)
Rx called in and Left message on machine for patient. 

## 2017-12-15 ENCOUNTER — Other Ambulatory Visit: Payer: Self-pay | Admitting: Family Medicine

## 2018-01-08 ENCOUNTER — Ambulatory Visit: Payer: Self-pay

## 2018-01-08 NOTE — Telephone Encounter (Signed)
Pt. Report she noticed bright red blood in stool 2 weeks ago, but it went away and now is back. Reports "lots of gas and diarrhea as well." Requests an appointment for next Tuesday. Scheduled.  Reason for Disposition . MODERATE rectal bleeding (small blood clots, passing blood without stool, or toilet water turns red)  Answer Assessment - Initial Assessment Questions 1. APPEARANCE of BLOOD: "What color is it?" "Is it passed separately, on the surface of the stool, or mixed in with the stool?"      Fresh blood 2. AMOUNT: "How much blood was passed?"      Not that much 3. FREQUENCY: "How many times has blood been passed with the stools?"      Periodically 4. ONSET: "When was the blood first seen in the stools?" (Days or weeks)      Started 2 weks ago 5. DIARRHEA: "Is there also some diarrhea?" If so, ask: "How many diarrhea stools were passed in past 24 hours?"      2-3 6. CONSTIPATION: "Do you have constipation?" If so, "How bad is it?"     Ocassional 7. RECURRENT SYMPTOMS: "Have you had blood in your stools before?" If so, ask: "When was the last time?" and "What happened that time?"      No 8. BLOOD THINNERS: "Do you take any blood thinners?" (e.g., Coumadin/warfarin, Pradaxa/dabigatran, aspirin)     No 9. OTHER SYMPTOMS: "Do you have any other symptoms?"  (e.g., abdominal pain, vomiting, dizziness, fever)     Cramping and gas 10. PREGNANCY: "Is there any chance you are pregnant?" "When was your last menstrual period?"       No  Protocols used: RECTAL BLEEDING-A-AH

## 2018-01-13 ENCOUNTER — Encounter: Payer: Self-pay | Admitting: Gastroenterology

## 2018-01-13 ENCOUNTER — Ambulatory Visit (INDEPENDENT_AMBULATORY_CARE_PROVIDER_SITE_OTHER): Payer: 59 | Admitting: Family Medicine

## 2018-01-13 ENCOUNTER — Encounter: Payer: Self-pay | Admitting: Family Medicine

## 2018-01-13 VITALS — BP 118/72 | HR 73 | Temp 98.3°F | Resp 12 | Ht 65.0 in | Wt 133.0 lb

## 2018-01-13 DIAGNOSIS — R1084 Generalized abdominal pain: Secondary | ICD-10-CM

## 2018-01-13 DIAGNOSIS — K921 Melena: Secondary | ICD-10-CM | POA: Diagnosis not present

## 2018-01-13 DIAGNOSIS — K582 Mixed irritable bowel syndrome: Secondary | ICD-10-CM

## 2018-01-13 NOTE — Patient Instructions (Signed)
  [name] I have seen you today for an acute visit.  A few things to remember from today's visit:   HEMATOCHEZIA - Plan: Ambulatory referral to Gastroenterology  Diffuse abdominal pain - Plan: Ambulatory referral to Gastroenterology  Irritable bowel syndrome with both constipation and diarrhea - Plan: Ambulatory referral to Gastroenterology    Metamucil may help.   Abdominal Bloating When you have abdominal bloating, your abdomen may feel full, tight, or painful. It may also look bigger than normal or swollen (distended). Common causes of abdominal bloating include:  Swallowing air.  Constipation.  Problems digesting food.  Eating too much.  Irritable bowel syndrome. This is a condition that affects the large intestine.  Lactose intolerance. This is an inability to digest lactose, a natural sugar in dairy products.  Celiac disease. This is a condition that affects the ability to digest gluten, a protein found in some grains.  Gastroparesis. This is a condition that slows down the movement of food in the stomach and small intestine. It is more common in people with diabetes mellitus.  Gastroesophageal reflux disease (GERD). This is a digestive condition that makes stomach acid flow back into the esophagus.  Urinary retention. This means that the body is holding onto urine, and the bladder cannot be emptied all the way.  Follow these instructions at home: Eating and drinking  Avoid eating too much.  Try not to swallow air while talking or eating.  Avoid eating while lying down.  Avoid these foods and drinks: ? Foods that cause gas, such as broccoli, cabbage, cauliflower, and baked beans. ? Carbonated drinks. ? Hard candy. ? Chewing gum. Medicines  Take over-the-counter and prescription medicines only as told by your health care provider.  Take probiotic medicines. These medicines contain live bacteria or yeasts that can help digestion.  Take coated  peppermint oil capsules. Activity  Try to exercise regularly. Exercise may help to relieve bloating that is caused by gas and relieve constipation. General instructions  Keep all follow-up visits as told by your health care provider. This is important. Contact a health care provider if:  You have nausea and vomiting.  You have diarrhea.  You have abdominal pain.  You have unusual weight loss or weight gain.  You have severe pain, and medicines do not help. Get help right away if:  You have severe chest pain.  You have trouble breathing.  You have shortness of breath.  You have trouble urinating.  You have darker urine than normal.  You have blood in your stools or have dark, tarry stools. Summary  Abdominal bloating means that the abdomen is swollen.  Common causes of abdominal bloating are swallowing air, constipation, and problems digesting food.  Avoid eating too much and avoid swallowing air.  Avoid foods that cause gas, carbonated drinks, hard candy, and chewing gum. This information is not intended to replace advice given to you by your health care provider. Make sure you discuss any questions you have with your health care provider. Document Released: 10/25/2016 Document Revised: 10/25/2016 Document Reviewed: 10/25/2016 Elsevier Interactive Patient Education  [employer].     In general please monitor for signs of worsening symptoms and seek immediate medical attention if any concerning.  If symptoms are not resolved in a few days/weeks you should schedule a follow up appointment with your doctor, before if needed.  I hope you get better soon!

## 2018-01-13 NOTE — Progress Notes (Signed)
ACUTE VISIT   HPI:  Chief Complaint  Patient presents with  . GI issues    blood in stool 3 days last week, but has stopped. Constant gas and abdominal pain for 2 months   PCP is not available.  [name] is a 47 y.o. female, who is here today complaining of 2-3 months of blood on tissue noted after defecation, intermittent abdominal pain, diarrhea, and constipation.  According to patient, she has had similar symptoms for years, they seemed to improve and started again about 3 months ago. Sometimes she also has dyschezia. She reports history of "severe GI issues." She states that the blood with bowel movements is a new problem, last episode a week ago.   Intermittent mild to moderate abdominal "gas" pain, cramps, sometimes better after defecation.  She has not identified exacerbating factors.  Family history negative for colon cancer or inflammatory bowel disease.  She is also concerned about weight loss, she has lost about 10 pounds.  She is also following a healthy diet and doing intermittent fasting.  She is requesting a referral to GI, she is concerned about colon cancer and would like to have a colonoscopy.  Reviewing records, she had a colonoscopy in 06/2010 due to hematochezia, diarrhea, and abdominal pain.  Negative otherwise except for internal hemorrhoids.  She has not noted nasal/gum bleeding, easy bruising, or gross hematuria. No fever, chills, or night sweats.   Review of Systems  Constitutional: Positive for unexpected weight change. Negative for chills, fatigue and fever.  HENT: Negative for mouth sores, sore throat and trouble swallowing.   Respiratory: Negative for cough, shortness of breath and wheezing.   Cardiovascular: Negative for leg swelling.  Gastrointestinal: Positive for abdominal pain, anal bleeding, constipation, diarrhea and vomiting. Negative for nausea.  Genitourinary: Negative for decreased urine volume, dysuria and hematuria.   Musculoskeletal: Negative for arthralgias, joint swelling and myalgias.  Skin: Negative for pallor and rash.  Allergic/Immunologic: Negative for food allergies.  Neurological: Negative for syncope, weakness and headaches.  Hematological: Negative for adenopathy. Does not bruise/bleed easily.  Psychiatric/Behavioral: Positive for sleep disturbance. Negative for confusion.      Current Outpatient Medications on File Prior to Visit  Medication Sig Dispense Refill  . cetirizine (ZYRTEC) 10 MG tablet Take 10 mg by mouth daily.    Marland Kitchen levonorgestrel (MIRENA, 52 MG,) 20 MCG/24HR IUD Mirena 20 mcg/24 hours (5 yrs) 52 mg intrauterine device  Take 1 device by intrauterine route.    . sertraline (ZOLOFT) 100 MG tablet Take 1 tablet (100 mg total) by mouth daily. 90 tablet 3  . valACYclovir (VALTREX) 1000 MG tablet TAKE TWO TABLETS BY MOUTH TWICE A DAY 30 tablet 3  . zolpidem (AMBIEN) 10 MG tablet Take 1 tablet (10 mg total) by mouth at bedtime as needed. For sleep 30 tablet 1  . temazepam (RESTORIL) 15 MG capsule Take 1 capsule (15 mg total) by mouth at bedtime as needed for sleep. (Patient not taking: Reported on 01/13/2018) 15 capsule 0   No current facility-administered medications on file prior to visit.      Past Medical History:  Diagnosis Date  . ABDOMINAL PAIN 05/05/2008  . ABDOMINAL PAIN OTHER SPECIFIED SITE 03/23/2009  . ALLERGIC  RHINITIS 08/21/2006  . CERVICAL LYMPHADENOPATHY 06/08/2009  . DEPRESSION 08/21/2006  . Diarrhea 03/23/2009  . HEMATOCHEZIA 05/10/2010  . HEMORRHOIDS 03/23/2009  . HEMORRHOIDS-EXTERNAL 03/23/2009  . INSOMNIA, TRANSIENT 07/26/2010   Allergies  Allergen Reactions  . Codeine  Nausea And Vomiting    Social History   Socioeconomic History  . Marital status: Married    Spouse name: Not on file  . Number of children: Not on file  . Years of education: Not on file  . Highest education level: Not on file  Occupational History  . Not on file  Social Needs  .  Financial resource strain: Not on file  . Food insecurity:    Worry: Not on file    Inability: Not on file  . Transportation needs:    Medical: Not on file    Non-medical: Not on file  Tobacco Use  . Smoking status: Never Smoker  . Smokeless tobacco: Never Used  Substance and Sexual Activity  . Alcohol use: Yes    Comment: Occasional  . Drug use: No  . Sexual activity: Not on file  Lifestyle  . Physical activity:    Days per week: Not on file    Minutes per session: Not on file  . Stress: Not on file  Relationships  . Social connections:    Talks on phone: Not on file    Gets together: Not on file    Attends religious service: Not on file    Active member of club or organization: Not on file    Attends meetings of clubs or organizations: Not on file    Relationship status: Not on file  Other Topics Concern  . Not on file  Social History Narrative  . Not on file    Vitals:   01/13/18 0908  BP: 118/72  Pulse: 73  Resp: 12  Temp: 98.3 F (36.8 C)  SpO2: 99%   Body mass index is 22.13 kg/m.   Physical Exam  Nursing note and vitals reviewed. Constitutional: She is oriented to person, place, and time. She appears well-developed and well-nourished. She does not appear ill. No distress.  HENT:  Head: Normocephalic and atraumatic.  Mouth/Throat: Oropharynx is clear and moist and mucous membranes are normal.  Eyes: Conjunctivae are normal. No scleral icterus.  Cardiovascular: Normal rate and regular rhythm.  No murmur heard. Respiratory: Effort normal and breath sounds normal. No respiratory distress.  GI: Soft. She exhibits no distension and no mass. Bowel sounds are increased. There is no hepatomegaly. There is no tenderness.  Genitourinary:  Genitourinary Comments: Rectal examination deferred to GI.  Musculoskeletal: She exhibits no edema.  Lymphadenopathy:    She has no cervical adenopathy.       Right: No supraclavicular adenopathy present.       Left: No  supraclavicular adenopathy present.  Neurological: She is alert and oriented to person, place, and time. She has normal strength.  Skin: Skin is warm. No rash noted. No erythema.  Psychiatric: She has a normal mood and affect.  Well groomed, good eye contact.    ASSESSMENT AND PLAN:   Ms. Seraphine was seen today for gi issues.  Diagnoses and all orders for this visit:  HEMATOCHEZIA  We discussed possible etiologies, we discussed indications for colonoscopy done in 06/2010. This problem seems to be chronic and most likely related to internal hemorrhoids. Instructed about warning signs. GI referral placed as requested.  -     Ambulatory referral to Gastroenterology  Diffuse abdominal pain  Abdominal examination today otherwise negative. Most likely related to IBS. She does not think blood work is necessary at this time.   -     Ambulatory referral to Gastroenterology  Irritable bowel syndrome with both constipation and  diarrhea  History suggest IBS. We discussed treatment options, I recommend OTC Metamucil. She will continue following with GI.   -     Ambulatory referral to Gastroenterology      Scotti Kosta G. Martin[place], MD  [facility]. [place] office.

## 2018-02-26 ENCOUNTER — Ambulatory Visit (INDEPENDENT_AMBULATORY_CARE_PROVIDER_SITE_OTHER): Payer: 59 | Admitting: Gastroenterology

## 2018-02-26 ENCOUNTER — Other Ambulatory Visit (INDEPENDENT_AMBULATORY_CARE_PROVIDER_SITE_OTHER): Payer: 59

## 2018-02-26 ENCOUNTER — Encounter: Payer: Self-pay | Admitting: Gastroenterology

## 2018-02-26 VITALS — BP 112/78 | HR 104 | Ht 65.0 in | Wt 126.0 lb

## 2018-02-26 DIAGNOSIS — R1030 Lower abdominal pain, unspecified: Secondary | ICD-10-CM

## 2018-02-26 DIAGNOSIS — R634 Abnormal weight loss: Secondary | ICD-10-CM | POA: Diagnosis not present

## 2018-02-26 DIAGNOSIS — K921 Melena: Secondary | ICD-10-CM

## 2018-02-26 LAB — CBC WITH DIFFERENTIAL/PLATELET
BASOS ABS: 0 10*3/uL (ref 0.0–0.1)
Basophils Relative: 0.3 % (ref 0.0–3.0)
Eosinophils Absolute: 0 10*3/uL (ref 0.0–0.7)
Eosinophils Relative: 0.4 % (ref 0.0–5.0)
HCT: 40.3 % (ref 36.0–46.0)
Hemoglobin: 13.5 g/dL (ref 12.0–15.0)
LYMPHS ABS: 1.1 10*3/uL (ref 0.7–4.0)
Lymphocytes Relative: 18.8 % (ref 12.0–46.0)
MCHC: 33.4 g/dL (ref 30.0–36.0)
MCV: 89.4 fl (ref 78.0–100.0)
MONO ABS: 0.4 10*3/uL (ref 0.1–1.0)
MONOS PCT: 7.1 % (ref 3.0–12.0)
NEUTROS ABS: 4.4 10*3/uL (ref 1.4–7.7)
NEUTROS PCT: 73.4 % (ref 43.0–77.0)
PLATELETS: 224 10*3/uL (ref 150.0–400.0)
RBC: 4.51 Mil/uL (ref 3.87–5.11)
RDW: 14.4 % (ref 11.5–15.5)
WBC: 6 10*3/uL (ref 4.0–10.5)

## 2018-02-26 LAB — LIPID PANEL
CHOL/HDL RATIO: 3
Cholesterol: 239 mg/dL — ABNORMAL HIGH (ref 0–200)
HDL: 77.2 mg/dL (ref >39.00)
LDL CALC: 144 mg/dL — AB (ref 0–99)
NONHDL: 161.85
Triglycerides: 89 mg/dL (ref 0.0–149.0)
VLDL: 17.8 mg/dL (ref 0.0–40.0)

## 2018-02-26 LAB — HEPATIC FUNCTION PANEL
ALK PHOS: 64 U/L (ref 39–117)
ALT: 8 U/L (ref 0–35)
AST: 13 U/L (ref 0–37)
Albumin: 4.7 g/dL (ref 3.5–5.2)
BILIRUBIN DIRECT: 0.1 mg/dL (ref 0.0–0.3)
BILIRUBIN TOTAL: 0.5 mg/dL (ref 0.2–1.2)
Total Protein: 7.7 g/dL (ref 6.0–8.3)

## 2018-02-26 LAB — TSH: TSH: 1.27 u[IU]/mL (ref 0.35–4.50)

## 2018-02-26 LAB — BASIC METABOLIC PANEL
BUN: 10 mg/dL (ref 6–23)
CALCIUM: 9.9 mg/dL (ref 8.4–10.5)
CHLORIDE: 102 meq/L (ref 96–112)
CO2: 29 mEq/L (ref 19–32)
Creatinine, Ser: 0.83 mg/dL (ref 0.40–1.20)
GFR: 78.31 mL/min (ref >60.00)
Glucose, Bld: 86 mg/dL (ref 70–99)
Potassium: 3.7 mEq/L (ref 3.5–5.1)
SODIUM: 139 meq/L (ref 135–145)

## 2018-02-26 LAB — IGA: IgA: 138 mg/dL (ref 68–378)

## 2018-02-26 MED ORDER — NA SULFATE-K SULFATE-MG SULF 17.5-3.13-1.6 GM/177ML PO SOLN: 1.0000 | Freq: Once | ORAL | 0 refills | Status: AC

## 2018-02-26 MED ORDER — GLYCOPYRROLATE 1 MG PO TABS: 1.0000 mg | ORAL_TABLET | Freq: Two times a day (BID) | ORAL | 11 refills | Status: DC

## 2018-02-26 NOTE — Addendum Note (Signed)
Addended by: Marzella Schlein on: 02/26/2018 12:39 PM   Modules accepted: Orders

## 2018-02-26 NOTE — Patient Instructions (Signed)
Your provider has requested that you go to the basement level for lab work.You need to be fasting for this lab work. Nothing to eat or drink after midnight. Press "B" on the elevator. The lab is located at the first door on the left as you exit the elevator.  You have been scheduled for an abdominal and pelvis ultrasound at [facility] (1st floor of hospital) on 03/05/18 at 10:30am. Please arrive 15 minutes prior to your appointment for registration. Make certain not to have anything to eat or drink after midnight prior to your appointment. Should you need to reschedule your appointment, please contact radiology at [phone]. This test typically takes about 30 minutes to perform.   You have been scheduled for a colonoscopy. Please follow written instructions given to you at your visit today.  Please pick up your prep supplies at the pharmacy within the next 1-3 days. If you use inhalers (even only as needed), please bring them with you on the day of your procedure. Your physician has requested that you go to www.startemmi.com and enter the access code given to you at your visit today. This web site gives a general overview about your procedure. However, you should still follow specific instructions given to you by our office regarding your preparation for the procedure.  Normal BMI (Body Mass Index- based on height and weight) is between 19 and 25. Your BMI today is Body mass index is 20.97 kg/m. Marland Kitchen Please consider follow up  regarding your BMI with your Primary Care Provider.  Thank you for choosing me and [facility].  Pricilla Riffle. Dagoberto Ligas., MD., [facility]

## 2018-02-26 NOTE — Progress Notes (Signed)
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  She has a long history of problems with irritable bowel syndrome with occasional diarrhea and mild abdominal pain.  Over the past 5 months her symptoms have worsened and have been associated with about a 20 pound weight loss over 5 months.  She had moderate amounts of bright red blood per rectum several times with bowel movements over the past few months however in the past few weeks she has not noted any rectal bleeding.  She has persistent problems with frequent diarrhea.  Denies constipation, change in stool caliber, melena, nausea, vomiting, dysphagia, reflux symptoms, chest pain.  Colonoscopy 06/2010:     1) Normal colon     2) Normal terminal ileum     3) Internal hemorrhoids   Allergies  Allergen Reactions  . Codeine Nausea And Vomiting   Outpatient Medications Prior to Visit  Medication Sig Dispense Refill  . cetirizine (ZYRTEC) 10 MG tablet Take 10 mg by mouth daily.    Marland Kitchen levonorgestrel (MIRENA, 52 MG,) 20 MCG/24HR IUD Mirena 20 mcg/24 hours (5 yrs) 52 mg intrauterine device  Take 1 device by intrauterine route.    . sertraline (ZOLOFT) 100 MG tablet Take 1 tablet (100 mg total) by mouth daily. 90 tablet 3  . valACYclovir (VALTREX) 1000 MG tablet TAKE TWO TABLETS BY MOUTH TWICE A DAY 30 tablet 3  . zolpidem (AMBIEN) 10 MG tablet Take 1 tablet (10 mg total) by mouth at bedtime as needed. For sleep 30 tablet 1  . temazepam (RESTORIL) 15 MG capsule Take 1 capsule (15 mg total) by mouth at bedtime as needed for sleep. (Patient not taking: Reported on 01/13/2018) 15 capsule 0   No facility-administered medications prior to visit.    Past Medical History:  Diagnosis Date  . ABDOMINAL PAIN 05/05/2008  . ABDOMINAL PAIN OTHER SPECIFIED SITE 03/23/2009  . ALLERGIC  RHINITIS 08/21/2006  . CERVICAL LYMPHADENOPATHY  06/08/2009  . DEPRESSION 08/21/2006  . Diarrhea 03/23/2009  . HEMATOCHEZIA 05/10/2010  . HEMORRHOIDS 03/23/2009  . HEMORRHOIDS-EXTERNAL 03/23/2009  . INSOMNIA, TRANSIENT 07/26/2010   Past Surgical History:  Procedure Laterality Date  . LEEP    . LEEP     Social History   Socioeconomic History  . Marital status: Married    Spouse name: Not on file  . Number of children: 1  . Years of education: Not on file  . Highest education level: Not on file  Occupational History  . Not on file  Social Needs  . Financial resource strain: Not on file  . Food insecurity:    Worry: Not on file    Inability: Not on file  . Transportation needs:    Medical: Not on file    Non-medical: Not on file  Tobacco Use  . Smoking status: Never Smoker  . Smokeless tobacco: Never Used  Substance and Sexual Activity  . Alcohol use: Yes    Comment: Occasional  . Drug use: No  . Sexual activity: Not on file  Lifestyle  . Physical activity:    Days per week: Not on file    Minutes per session: Not on file  . Stress: Not on file  Relationships  . Social connections:    Talks on phone: Not on file    Gets together: Not on file    Attends religious service: Not  on file    Active member of club or organization: Not on file    Attends meetings of clubs or organizations: Not on file    Relationship status: Not on file  Other Topics Concern  . Not on file  Social History Narrative  . Not on file   Family History  Problem Relation Age of Onset  . Cancer Other        brain, grandmother  . Heart disease Other       Review of Systems: Pertinent positive and negative review of systems were noted in the above HPI section. All other review of systems were otherwise negative.    Physical Exam: General: Well developed, well nourished, no acute distress Head: Normocephalic and atraumatic Eyes:  sclerae anicteric, EOMI Ears: Normal auditory acuity Mouth: No deformity or lesions Neck: Supple, no masses  or thyromegaly Lungs: Clear throughout to auscultation Heart: Regular rate and rhythm; no murmurs, rubs or bruits Abdomen: Soft, non tender and non distended. No masses, hepatosplenomegaly or hernias noted. Normal Bowel sounds Rectal: Deferred to colonoscopy Musculoskeletal: Symmetrical with no gross deformities  Skin: No lesions on visible extremities Pulses:  Normal pulses noted Extremities: No clubbing, cyanosis, edema or deformities noted Neurological: Alert oriented x 4, grossly nonfocal Cervical Nodes:  No significant cervical adenopathy Inguinal Nodes: No significant inguinal adenopathy Psychological:  Alert and cooperative. Normal mood and affect  Assessment and Recommendations:  1.  Lower abdominal pain, weight loss, hematochezia.  Suspected IBS with hemorrhoidal bleeding.  Rule out colorectal neoplasms, occult intra-abdominal neoplasms.  Glycopyrrolate 1 mg twice daily.  Preparation H suppositories daily as needed for presumed hemorrhoidal bleeding.  CBC, CMP, TSH, lipid panel, tTG, IgA  today.  Schedule abdominal ultrasound and pelvic ultrasound.  Schedule colonoscopy. The risks (including bleeding, perforation, infection, missed lesions, medication reactions and possible hospitalization or surgery if complications occur), benefits, and alternatives to colonoscopy with possible biopsy and possible polypectomy were discussed with the patient and they consent to proceed.    cc: [place], Betty G, [address], [place] [id number]

## 2018-02-27 LAB — TISSUE TRANSGLUTAMINASE, IGA: (TTG) AB, IGA: 1 U/mL

## 2018-03-04 ENCOUNTER — Other Ambulatory Visit: Payer: Self-pay | Admitting: Gastroenterology

## 2018-03-04 NOTE — Addendum Note (Signed)
Addended by: Marzella Schlein on: 03/04/2018 04:21 PM   Modules accepted: Orders

## 2018-03-05 ENCOUNTER — Ambulatory Visit (HOSPITAL_COMMUNITY)
Admission: RE | Admit: 2018-03-05 | Discharge: 2018-03-05 | Disposition: A | Discharge status: Home / Self Care | Payer: 59 | Source: Ambulatory Visit | Attending: Gastroenterology | Admitting: Gastroenterology

## 2018-03-05 DIAGNOSIS — R634 Abnormal weight loss: Secondary | ICD-10-CM | POA: Insufficient documentation

## 2018-03-05 DIAGNOSIS — R1030 Lower abdominal pain, unspecified: Secondary | ICD-10-CM

## 2018-03-05 DIAGNOSIS — K828 Other specified diseases of gallbladder: Secondary | ICD-10-CM | POA: Diagnosis not present

## 2018-03-05 DIAGNOSIS — Z975 Presence of (intrauterine) contraceptive device: Secondary | ICD-10-CM | POA: Insufficient documentation

## 2018-03-05 DIAGNOSIS — K921 Melena: Secondary | ICD-10-CM | POA: Diagnosis not present

## 2018-03-05 DIAGNOSIS — K7689 Other specified diseases of liver: Secondary | ICD-10-CM | POA: Diagnosis not present

## 2018-03-05 DIAGNOSIS — R102 Pelvic and perineal pain: Secondary | ICD-10-CM | POA: Diagnosis not present

## 2018-03-06 ENCOUNTER — Encounter: Payer: Self-pay | Admitting: Family Medicine

## 2018-03-06 ENCOUNTER — Ambulatory Visit (INDEPENDENT_AMBULATORY_CARE_PROVIDER_SITE_OTHER): Payer: 59 | Admitting: Family Medicine

## 2018-03-06 VITALS — BP 110/80 | HR 83 | Temp 98.5°F | Wt 126.7 lb

## 2018-03-06 DIAGNOSIS — F339 Major depressive disorder, recurrent, unspecified: Secondary | ICD-10-CM | POA: Diagnosis not present

## 2018-03-06 DIAGNOSIS — F5102 Adjustment insomnia: Secondary | ICD-10-CM | POA: Diagnosis not present

## 2018-03-06 MED ORDER — ZOLPIDEM TARTRATE 10 MG PO TABS: 10.0000 mg | ORAL_TABLET | Freq: Every evening | ORAL | 1 refills | Status: DC | PRN

## 2018-03-06 MED ORDER — SERTRALINE HCL 100 MG PO TABS: ORAL_TABLET | ORAL | 3 refills | Status: DC

## 2018-03-06 NOTE — Progress Notes (Signed)
  Subjective:     Patient ID: [name], female   DOB: Jun 26, 1971, 47 y.o.   MRN: [id number]  HPI Patient here to discuss medications. She's had past history of depression but also some anxiety symptoms and feels that she may have some premenstrual dysphoric mood symptoms. She's noticed that each month around the time of her period she is more irritable and also feels more down.   She has been on Zoloft for several years at dosage of 100 mg. She would like to consider increasing to 150 mg. She also has history of some transient insomnia and takes Ambien for that but tries to use only once or twice per week.  She's had very stressful year. Her boyfriend committed suicide several months ago. She has had some counseling.  She's had some recent GI complaints has been followed by gastroenterology and had multiple recent labs were basically normal with exception of elevated lipids. Her HDL though was very high as well  Past Medical History:  Diagnosis Date  . ABDOMINAL PAIN 05/05/2008  . ABDOMINAL PAIN OTHER SPECIFIED SITE 03/23/2009  . ALLERGIC  RHINITIS 08/21/2006  . CERVICAL LYMPHADENOPATHY 06/08/2009  . DEPRESSION 08/21/2006  . Diarrhea 03/23/2009  . HEMATOCHEZIA 05/10/2010  . HEMORRHOIDS 03/23/2009  . HEMORRHOIDS-EXTERNAL 03/23/2009  . INSOMNIA, TRANSIENT 07/26/2010   Past Surgical History:  Procedure Laterality Date  . LEEP    . LEEP      reports that she has never smoked. She has never used smokeless tobacco. She reports that she drinks alcohol. She reports that she does not use drugs. family history includes Cancer in her other; Heart disease in her other. Allergies  Allergen Reactions  . Codeine Nausea And Vomiting     Review of Systems  Constitutional: Negative for appetite change, chills and fever.  Psychiatric/Behavioral: Positive for dysphoric mood and sleep disturbance. The patient is nervous/anxious.        Objective:   Physical Exam  Constitutional: She appears  well-developed and well-nourished.  Cardiovascular: Normal rate.  Psychiatric: She has a normal mood and affect. Her behavior is normal.       Assessment:     #1 history of transient insomnia  #2 history of recurrent depression/anxiety    Plan:     -Refilled Ambien for as needed use -Increase sertraline to 150 mg once daily -We have encouraged her to give some feedback if she is not seeing improvement in symptoms above with increase in sertraline dosage -Continue close follow-up with GI regarding her GI symptoms  Eulas Post MD [facility]

## 2018-04-22 DIAGNOSIS — N760 Acute vaginitis: Secondary | ICD-10-CM | POA: Diagnosis not present

## 2018-04-27 ENCOUNTER — Encounter: Payer: Self-pay | Admitting: Gastroenterology

## 2018-05-07 ENCOUNTER — Encounter: Payer: Self-pay | Admitting: Gastroenterology

## 2018-05-07 ENCOUNTER — Ambulatory Visit (AMBULATORY_SURGERY_CENTER): Payer: 59 | Admitting: Gastroenterology

## 2018-05-07 VITALS — BP 126/75 | HR 65 | Temp 98.6°F | Resp 10 | Ht 65.0 in | Wt 126.0 lb

## 2018-05-07 DIAGNOSIS — K648 Other hemorrhoids: Secondary | ICD-10-CM | POA: Diagnosis not present

## 2018-05-07 DIAGNOSIS — D120 Benign neoplasm of cecum: Secondary | ICD-10-CM

## 2018-05-07 DIAGNOSIS — D123 Benign neoplasm of transverse colon: Secondary | ICD-10-CM

## 2018-05-07 DIAGNOSIS — K921 Melena: Secondary | ICD-10-CM

## 2018-05-07 MED ORDER — SODIUM CHLORIDE 0.9 % IV SOLN: 500.0000 mL | Freq: Once | INTRAVENOUS | Status: DC

## 2018-05-07 NOTE — Progress Notes (Signed)
Called to room to assist during endoscopic procedure.  Patient ID and intended procedure confirmed with present staff. Received instructions for my participation in the procedure from the performing physician.  

## 2018-05-07 NOTE — Op Note (Addendum)
[place] Patient Name: [name] Procedure Date: 05/07/2018 1:31 PM MRN: [id number] Endoscopist: Ladene Artist , MD Age: 47 Referring MD:  Date of Birth: 11-11-1970 Gender: Female Account #: [id number] Procedure:                Colonoscopy Indications:              Hematochezia Medicines:                Monitored Anesthesia Care Procedure:                Pre-Anesthesia Assessment:                           - Prior to the procedure, a History and Physical                            was performed, and patient medications and                            allergies were reviewed. The patient's tolerance of                            previous anesthesia was also reviewed. The risks                            and benefits of the procedure and the sedation                            options and risks were discussed with the patient.                            All questions were answered, and informed consent                            was obtained. Prior Anticoagulants: The patient has                            taken no previous anticoagulant or antiplatelet                            agents. ASA Grade Assessment: II - A patient with                            mild systemic disease. After reviewing the risks                            and benefits, the patient was deemed in                            satisfactory condition to undergo the procedure.                           After obtaining informed consent, the colonoscope  was passed under direct vision. Throughout the                            procedure, the patient's blood pressure, pulse, and                            oxygen saturations were monitored continuously. The                            Model [id number]-[id number]) scope was introduced                            through the anus and advanced to the the terminal                            ileum, with identification of the  appendiceal                            orifice and IC valve. The terminal ileum, ileocecal                            valve, appendiceal orifice, and rectum were                            photographed. The quality of the bowel preparation                            was excellent. The colonoscopy was performed                            without difficulty. The patient tolerated the                            procedure well. Scope In: 1:36:31 PM Scope Out: 1:52:05 PM Scope Withdrawal Time: 0 hours 12 minutes 57 seconds  Total Procedure Duration: 0 hours 15 minutes 34 seconds  Findings:                 The perianal and digital rectal examinations were                            normal.                           A 14 mm polyp was found in the transverse colon.                            The polyp was sessile. The polyp was removed with a                            hot snare. Resection and retrieval were complete.                           A 4 mm polyp was found in the cecum. The polyp was  sessile. The polyp was removed with a cold biopsy                            forceps. Resection and retrieval were complete.                           Internal hemorrhoids were found during                            retroflexion. One column was larger than the other                            two. The hemorrhoids were small and Grade I                            (internal hemorrhoids that do not prolapse).                           The exam was otherwise without abnormality on                            direct and retroflexion views.                           The terminal ileum appeared normal. Complications:            No immediate complications. Estimated blood loss:                            None. Estimated Blood Loss:     Estimated blood loss: none. Impression:               - One 14 mm polyp in the transverse colon, removed                            with a hot snare.  Resected and retrieved.                           - One 4 mm polyp in the cecum, removed with a cold                            biopsy forceps. Resected and retrieved.                           - The examined portion of the terminal ileum was                            normal.                           - Internal hemorrhoids.                           - The examination was otherwise normal on direct  and retroflexion views. Recommendation:           - Repeat colonoscopy date to be determined after                            pending pathology results are reviewed for                            surveillance.                           - Patient has a contact number available for                            emergencies. The signs and symptoms of potential                            delayed complications were discussed with the                            patient. Return to normal activities tomorrow.                            Written discharge instructions were provided to the                            patient.                           - Resume previous diet.                           - Continue present medications.                           - Await pathology results.                           - No aspirin, ibuprofen, naproxen, or other                            non-steroidal anti-inflammatory drugs for 2 weeks                            after polyp removal.                           - Schedule hemorrhoidal banding for the larger                            column and assess response. Ladene Artist, MD 05/07/2018 1:57:48 PM This report has been signed electronically.

## 2018-05-07 NOTE — Progress Notes (Signed)
Report given to PACU, vss 

## 2018-05-07 NOTE — Patient Instructions (Signed)
Please read handouts on Polyps and Hemorrhoids. No Aspirin, Ibuprofen, Naproxen, or other non-steriodal anti-inflammatory drugs for 2 weeks. Continue present medications.    YOU HAD AN ENDOSCOPIC PROCEDURE TODAY AT [facility]:   Refer to the procedure report that was given to you for any specific questions about what was found during the examination.  If the procedure report does not answer your questions, please call your gastroenterologist to clarify.  If you requested that your care partner not be given the details of your procedure findings, then the procedure report has been included in a sealed envelope for you to review at your convenience later.  YOU SHOULD EXPECT: Some feelings of bloating in the abdomen. Passage of more gas than usual.  Walking can help get rid of the air that was put into your GI tract during the procedure and reduce the bloating. If you had a lower endoscopy (such as a colonoscopy or flexible sigmoidoscopy) you may notice spotting of blood in your stool or on the toilet paper. If you underwent a bowel prep for your procedure, you may not have a normal bowel movement for a few days.  Please Note:  You might notice some irritation and congestion in your nose or some drainage.  This is from the oxygen used during your procedure.  There is no need for concern and it should clear up in a day or so.  SYMPTOMS TO REPORT IMMEDIATELY:   Following lower endoscopy (colonoscopy or flexible sigmoidoscopy):  Excessive amounts of blood in the stool  Significant tenderness or worsening of abdominal pains  Swelling of the abdomen that is new, acute  Fever of 100F or higher    For urgent or emergent issues, a gastroenterologist can be reached at any hour by calling [phone].   DIET:  We do recommend a small meal at first, but then you may proceed to your regular diet.  Drink plenty of fluids but you should avoid alcoholic beverages for 24  hours.  ACTIVITY:  You should plan to take it easy for the rest of today and you should NOT DRIVE or use heavy machinery until tomorrow (because of the sedation medicines used during the test).    FOLLOW UP: Our staff will call the number listed on your records the next business day following your procedure to check on you and address any questions or concerns that you may have regarding the information given to you following your procedure. If we do not reach you, we will leave a message.  However, if you are feeling well and you are not experiencing any problems, there is no need to return our call.  We will assume that you have returned to your regular daily activities without incident.  If any biopsies were taken you will be contacted by phone or by letter within the next 1-3 weeks.  Please call us at [phone] if you have not heard about the biopsies in 3 weeks.    SIGNATURES/CONFIDENTIALITY: You and/or your care partner have signed paperwork which will be entered into your electronic medical record.  These signatures attest to the fact that that the information above on your After Visit Summary has been reviewed and is understood.  Full responsibility of the confidentiality of this discharge information lies with you and/or your care-partner.

## 2018-05-08 ENCOUNTER — Telehealth: Payer: Self-pay

## 2018-05-08 NOTE — Telephone Encounter (Signed)
  Follow up Call-  Call back number 05/07/2018  Post procedure Call Back phone  # [phone]  Permission to leave phone message Yes  Some recent data might be hidden     Patient questions:  Do you have a fever, pain , or abdominal swelling? No. Pain Score  0 *  Have you tolerated food without any problems? Yes.    Have you been able to return to your normal activities? Yes.    Do you have any questions about your discharge instructions: Diet   No. Medications  No. Follow up visit  No.  Do you have questions or concerns about your Care? No.  Actions: * If pain score is 4 or above: No action needed, pain <4.

## 2018-05-08 NOTE — Telephone Encounter (Signed)
First post procedure follow up call, unable to leave voice mail, mailbox full

## 2018-05-15 ENCOUNTER — Encounter: Payer: Self-pay | Admitting: Gastroenterology

## 2018-05-15 NOTE — Telephone Encounter (Signed)
Error

## 2018-05-19 ENCOUNTER — Encounter: Payer: Self-pay | Admitting: Gastroenterology

## 2018-06-04 ENCOUNTER — Encounter: Payer: 59 | Admitting: Gastroenterology

## 2018-06-17 DIAGNOSIS — L814 Other melanin hyperpigmentation: Secondary | ICD-10-CM | POA: Diagnosis not present

## 2018-06-17 DIAGNOSIS — D1801 Hemangioma of skin and subcutaneous tissue: Secondary | ICD-10-CM | POA: Diagnosis not present

## 2018-06-17 DIAGNOSIS — L821 Other seborrheic keratosis: Secondary | ICD-10-CM | POA: Diagnosis not present

## 2018-07-09 ENCOUNTER — Other Ambulatory Visit: Payer: Self-pay | Admitting: Family Medicine

## 2018-07-10 NOTE — Telephone Encounter (Signed)
Temazepam is not on the current medication list.  zolpidem (AMBIEN) 10 MG tablet is on the list.  Okay to refill Temazepam?

## 2018-07-11 DIAGNOSIS — Z23 Encounter for immunization: Secondary | ICD-10-CM | POA: Diagnosis not present

## 2018-07-12 NOTE — Telephone Encounter (Signed)
Refilled once. 

## 2018-07-16 ENCOUNTER — Encounter: Payer: 59 | Admitting: Gastroenterology

## 2018-07-23 ENCOUNTER — Encounter: Payer: Self-pay | Admitting: Gastroenterology

## 2018-07-23 ENCOUNTER — Ambulatory Visit (INDEPENDENT_AMBULATORY_CARE_PROVIDER_SITE_OTHER): Payer: 59 | Admitting: Gastroenterology

## 2018-07-23 VITALS — BP 110/76 | HR 80 | Ht 65.0 in | Wt 126.0 lb

## 2018-07-23 DIAGNOSIS — K640 First degree hemorrhoids: Secondary | ICD-10-CM

## 2018-07-23 DIAGNOSIS — K648 Other hemorrhoids: Secondary | ICD-10-CM

## 2018-07-23 NOTE — Progress Notes (Signed)
PROCEDURE NOTE: The patient presents with symptomatic, bleeding grade 1 hemorrhoids, requesting rubber band ligation of their hemorrhoidal disease.  All risks, benefits and alternative forms of therapy were described and informed consent was obtained.  The anorectum was pre-medicated during digital exam with 0.125% NTG and 5% lidocaine. In the Left Lateral Decubitus position anoscopic examination revealed grade 1 hemorrhoids in the LL, RP, RA positions: RP > LL > RA.  The decision was made to band the RP internal hemorrhoid, and the [place] was used to perform band ligation without complication.  Digital anorectal examination was then performed to assure proper positioning of the band and to adjust the banded tissue as required. No adjustment was needed.   No complications were encountered and the patient tolerated the procedure well.  Dietary and behavioral recommendations were given and along with follow-up instructions.   Return for possible additional hemorrhoid banding in 2 - 3 weeks as required.  High fiber diet, Benefiber daily and at least 8 glasses of water daily. The patient was discharged home without pain or other post procedure problems.

## 2018-07-23 NOTE — Patient Instructions (Signed)
Please use Benefiber daily.   High-Fiber Diet Fiber, also called dietary fiber, is a type of carbohydrate found in fruits, vegetables, whole grains, and beans. A high-fiber diet can have many health benefits. Your health care provider may recommend a high-fiber diet to help:  Prevent constipation. Fiber can make your bowel movements more regular.  Lower your cholesterol.  Relieve hemorrhoids, uncomplicated diverticulosis, or irritable bowel syndrome.  Prevent overeating as part of a weight-loss plan.  Prevent heart disease, type 2 diabetes, and certain cancers.  What is my plan? The recommended daily intake of fiber includes:  38 grams for men under age 109.  64 grams for men over age 15.  31 grams for women under age 68.  40 grams for women over age 55.  You can get the recommended daily intake of dietary fiber by eating a variety of fruits, vegetables, grains, and beans. Your health care provider may also recommend a fiber supplement if it is not possible to get enough fiber through your diet. What do I need to know about a high-fiber diet?  Fiber supplements have not been widely studied for their effectiveness, so it is better to get fiber through food sources.  Always check the fiber content on thenutrition facts label of any prepackaged food. Look for foods that contain at least 5 grams of fiber per serving.  Ask your dietitian if you have questions about specific foods that are related to your condition, especially if those foods are not listed in the following section.  Increase your daily fiber consumption gradually. Increasing your intake of dietary fiber too quickly may cause bloating, cramping, or gas.  Drink plenty of water. Water helps you to digest fiber. What foods can I eat? Grains Whole-grain breads. Multigrain cereal. Oats and oatmeal. Brown rice. Barley. Bulgur wheat. [place]. Bran muffins. Popcorn. Rye wafer crackers. Vegetables Sweet potatoes. Spinach.  Kale. Artichokes. Cabbage. Broccoli. Green peas. Carrots. Squash. Fruits Berries. Pears. Apples. Oranges. Avocados. Prunes and raisins. Dried figs. Meats and Other Protein Sources [place], kidney, pinto, and soy beans. Split peas. Lentils. Nuts and seeds. Dairy Fiber-fortified yogurt. Beverages Fiber-fortified soy milk. Fiber-fortified orange juice. Other Fiber bars. The items listed above may not be a complete list of recommended foods or beverages. Contact your dietitian for more options. What foods are not recommended? Grains White bread. Pasta made with refined flour. White rice. Vegetables Fried potatoes. Canned vegetables. Well-cooked vegetables. Fruits Fruit juice. Cooked, strained fruit. Meats and Other Protein Sources Fatty cuts of meat. Fried Sales executive or fried fish. Dairy Milk. Yogurt. Cream cheese. Sour cream. Beverages Soft drinks. Other Cakes and pastries. Butter and oils. The items listed above may not be a complete list of foods and beverages to avoid. Contact your dietitian for more information. What are some tips for including high-fiber foods in my diet?  Eat a wide variety of high-fiber foods.  Make sure that half of all grains consumed each day are whole grains.  Replace breads and cereals made from refined flour or white flour with whole-grain breads and cereals.  Replace white rice with brown rice, bulgur wheat, or millet.  Start the day with a breakfast that is high in fiber, such as a cereal that contains at least 5 grams of fiber per serving.  Use beans in place of meat in soups, salads, or pasta.  Eat high-fiber snacks, such as berries, raw vegetables, nuts, or popcorn. This information is not intended to replace advice given to you by your health care  provider. Make sure you discuss any questions you have with your health care provider. Document Released: 09/23/2005 Document Revised: 02/29/2016 Document Reviewed: 03/08/2014 Elsevier Interactive  Patient Education  2018 [place]   1. The procedure you have had should have been relatively painless since the banding of the area involved does not have nerve endings and there is no pain sensation.  The rubber band cuts off the blood supply to the hemorrhoid and the band may fall off as soon as 48 hours after the banding (the band may occasionally be seen in the toilet bowl following a bowel movement). You may notice a temporary feeling of fullness in the rectum which should respond adequately to plain Tylenol or Motrin.  2. Following the banding, avoid strenuous exercise that evening and resume full activity the next day.  A sitz bath (soaking in a warm tub) or bidet is soothing, and can be useful for cleansing the area after bowel movements.     3. To avoid constipation, take two tablespoons of natural wheat bran, natural oat bran, flax, Benefiber or any over the counter fiber supplement and increase your water intake to 7-8 glasses daily.    4. Unless you have been prescribed anorectal medication, do not put anything inside your rectum for two weeks: No suppositories, enemas, fingers, etc.  5. Occasionally, you may have more bleeding than usual after the banding procedure.  This is often from the untreated hemorrhoids rather than the treated one.  Don't be concerned if there is a tablespoon or so of blood.  If there is more blood than this, lie flat with your bottom higher than your head and apply an ice pack to the area. If the bleeding does not stop within a half an hour or if you feel faint, call our office at [phone] or go to the emergency room.  6. Problems are not common; however, if there is a substantial amount of bleeding, severe pain, chills, fever or difficulty passing urine (very rare) or other problems, you should call us at [phone] or report to the nearest emergency room.  7. Do not stay seated  continuously for more than 2-3 hours for a day or two after the procedure.  Tighten your buttock muscles 10-15 times every two hours and take 10-15 deep breaths every 1-2 hours.  Do not spend more than a few minutes on the toilet if you cannot empty your bowel; instead re-visit the toilet at a later time.

## 2018-07-30 DIAGNOSIS — Z79891 Long term (current) use of opiate analgesic: Secondary | ICD-10-CM | POA: Diagnosis not present

## 2018-08-11 ENCOUNTER — Encounter: Payer: 59 | Admitting: Gastroenterology

## 2018-08-13 ENCOUNTER — Encounter: Payer: 59 | Admitting: Gastroenterology

## 2018-08-20 ENCOUNTER — Encounter: Payer: 59 | Admitting: Gastroenterology

## 2018-09-10 ENCOUNTER — Ambulatory Visit (INDEPENDENT_AMBULATORY_CARE_PROVIDER_SITE_OTHER): Payer: 59 | Admitting: Gastroenterology

## 2018-09-10 ENCOUNTER — Encounter: Payer: Self-pay | Admitting: Gastroenterology

## 2018-09-10 VITALS — BP 124/78 | HR 76 | Ht 66.0 in | Wt 124.0 lb

## 2018-09-10 DIAGNOSIS — K648 Other hemorrhoids: Secondary | ICD-10-CM

## 2018-09-10 DIAGNOSIS — K640 First degree hemorrhoids: Secondary | ICD-10-CM | POA: Diagnosis not present

## 2018-09-10 NOTE — Progress Notes (Signed)
PROCEDURE NOTE: The patient presents with symptomatic bleeding grade 1 hemorrhoids, requesting rubber band ligation of their hemorrhoidal disease.  All risks, benefits and alternative forms of therapy were described and informed consent was obtained. RP hemorrhoid was banded on 07/23/2018.  The anorectum was pre-medicated during digital exam with 0.125% NTG and 5% lidocaine.   The decision was made to band the LL internal hemorrhoid, and the [place] was used to perform band ligation without complication.  Digital anorectal examination was then performed to assure proper positioning of the band and to adjust the banded tissue as required. No adjustment was needed.  No complications were encountered and the patient tolerated the procedure well.  Dietary and behavioral recommendations were given and along with follow-up instructions.   Return for possible additional hemorrhoid banding in 2 - 3 weeks as required.  High fiber diet, Benefiber daily and at least 8 glasses of water daily. The patient was discharged home without pain or other post procedure problems.

## 2018-09-10 NOTE — Patient Instructions (Signed)

## 2018-09-17 DIAGNOSIS — Z130 Encounter for screening for diseases of the blood and blood-forming organs and certain disorders involving the immune mechanism: Secondary | ICD-10-CM | POA: Diagnosis not present

## 2018-09-17 DIAGNOSIS — Z1231 Encounter for screening mammogram for malignant neoplasm of breast: Secondary | ICD-10-CM | POA: Diagnosis not present

## 2018-09-17 DIAGNOSIS — Z124 Encounter for screening for malignant neoplasm of cervix: Secondary | ICD-10-CM | POA: Diagnosis not present

## 2018-09-17 DIAGNOSIS — Z01419 Encounter for gynecological examination (general) (routine) without abnormal findings: Secondary | ICD-10-CM | POA: Diagnosis not present

## 2018-10-15 ENCOUNTER — Encounter: Payer: 59 | Admitting: Gastroenterology

## 2018-10-28 ENCOUNTER — Encounter: Payer: Self-pay | Admitting: Gastroenterology

## 2018-10-28 ENCOUNTER — Ambulatory Visit (INDEPENDENT_AMBULATORY_CARE_PROVIDER_SITE_OTHER): Payer: 59 | Admitting: Gastroenterology

## 2018-10-28 VITALS — BP 112/88 | HR 80 | Ht 65.0 in | Wt 127.6 lb

## 2018-10-28 DIAGNOSIS — K648 Other hemorrhoids: Secondary | ICD-10-CM

## 2018-10-28 NOTE — Progress Notes (Signed)
PROCEDURE NOTE: The patient presents with symptomatic bleeding grade 1 hemorrhoids, requesting rubber band ligation of their hemorrhoidal disease.  All risks, benefits and alternative forms of therapy were described and informed consent was obtained.   RP banded on 10/17 and LL banded on 12/5. Both were well tolerated. She has noted less frequent rectal bleeding.    The anorectum was pre-medicated during digital exam with 0.125% NTG and 5% lidocaine.  The decision was made to band the RA internal hemorrhoid, and the [place] was used to perform band ligation without complication.  Digital anorectal examination was then performed to assure proper positioning of the band and to adjust the banded tissue as required. No adjustment was needed.  No complications were encountered and the patient tolerated the procedure well.  Dietary and behavioral recommendations were given and along with follow-up instructions.     High fiber diet, Benefiber daily and at least 8 glasses of water daily. The patient was discharged home without pain or other post procedure problems.  GI follow up prn.

## 2018-10-28 NOTE — Patient Instructions (Addendum)
If you are age 48 or older, your body mass index should be between 23-30. Your Body mass index is 21.23 kg/m. If this is out of the aforementioned range listed, please consider follow up with your Primary Care Provider.  If you are age 12 or younger, your body mass index should be between 19-25. Your Body mass index is 21.23 kg/m. If this is out of the aformentioned range listed, please consider follow up with your Primary Care Provider.    HEMORRHOID BANDING PROCEDURE    FOLLOW-UP CARE   1. The procedure you have had should have been relatively painless since the banding of the area involved does not have nerve endings and there is no pain sensation.  The rubber band cuts off the blood supply to the hemorrhoid and the band may fall off as soon as 48 hours after the banding (the band may occasionally be seen in the toilet bowl following a bowel movement). You may notice a temporary feeling of fullness in the rectum which should respond adequately to plain Tylenol or Motrin.  2. Following the banding, avoid strenuous exercise that evening and resume full activity the next day.  A sitz bath (soaking in a warm tub) or bidet is soothing, and can be useful for cleansing the area after bowel movements.     3. To avoid constipation, take two tablespoons of natural wheat bran, natural oat bran, flax, Benefiber or any over the counter fiber supplement and increase your water intake to 7-8 glasses daily.    4. Unless you have been prescribed anorectal medication, do not put anything inside your rectum for two weeks: No suppositories, enemas, fingers, etc.  5. Occasionally, you may have more bleeding than usual after the banding procedure.  This is often from the untreated hemorrhoids rather than the treated one.  Don't be concerned if there is a tablespoon or so of blood.  If there is more blood than this, lie flat with your bottom higher than your head and apply an ice pack to the area. If the  bleeding does not stop within a half an hour or if you feel faint, call our office at [phone] or go to the emergency room.  6. Problems are not common; however, if there is a substantial amount of bleeding, severe pain, chills, fever or difficulty passing urine (very rare) or other problems, you should call us at [phone] or report to the nearest emergency room.  7. Do not stay seated continuously for more than 2-3 hours for a day or two after the procedure.  Tighten your buttock muscles 10-15 times every two hours and take 10-15 deep breaths every 1-2 hours.  Do not spend more than a few minutes on the toilet if you cannot empty your bowel; instead re-visit the toilet at a later time.   Follow up as needed.   You have a recall for Colonoscopy in August 2022  Thank you for choosing me and [facility].  Pricilla Riffle. Dagoberto Ligas., MD., [facility]

## 2019-01-19 ENCOUNTER — Other Ambulatory Visit: Payer: Self-pay

## 2019-01-19 ENCOUNTER — Ambulatory Visit (INDEPENDENT_AMBULATORY_CARE_PROVIDER_SITE_OTHER): Payer: 59 | Admitting: Family Medicine

## 2019-01-19 DIAGNOSIS — G4700 Insomnia, unspecified: Secondary | ICD-10-CM | POA: Diagnosis not present

## 2019-01-19 DIAGNOSIS — B001 Herpesviral vesicular dermatitis: Secondary | ICD-10-CM | POA: Diagnosis not present

## 2019-01-19 DIAGNOSIS — F339 Major depressive disorder, recurrent, unspecified: Secondary | ICD-10-CM

## 2019-01-19 MED ORDER — TEMAZEPAM 15 MG PO CAPS: ORAL_CAPSULE | ORAL | 1 refills | Status: DC

## 2019-01-19 MED ORDER — VALACYCLOVIR HCL 500 MG PO TABS: 500.0000 mg | ORAL_TABLET | Freq: Every day | ORAL | 1 refills | Status: DC

## 2019-01-19 MED ORDER — SERTRALINE HCL 100 MG PO TABS: ORAL_TABLET | ORAL | 3 refills | Status: DC

## 2019-01-19 NOTE — Progress Notes (Signed)
Virtual Visit via Video Note  I connected with [name] on 01/19/19 at  1:15 PM EDT by a video enabled telemedicine application and verified that I am speaking with the correct person using two identifiers.  Location patient: home Location provider:work or home office Persons participating in the virtual visit: patient, provider  I discussed the limitations of evaluation and management by telemedicine and the availability of in person appointments. The patient expressed understanding and agreed to proceed.   HPI: Patient was seen by video enabled conference today to discuss the following issues  History of recurrent depression.  She has been on sertraline 100 mg 1-1/2 tablets daily for several years.  This seems to working fairly well.  She is requesting refills.  No side effects.  Chronic insomnia.  She has taken Ambien in the past but has had some problems with difficulty with recollection of the events the next day.  She had seen psychiatrist previously and received temazepam 15 mg which seemed to work better.  She tries to take infrequently usually once or twice a week.  She has history of recurrent cold sores.  She has taken Valtrex in the past sporadically but would like to consider suppression.  She has had 3 outbreaks in just a couple of months.  She has noted in the past she has these more frequently during the summer months  ROS: See pertinent positives and negatives per HPI.  Past Medical History:  Diagnosis Date  . ABDOMINAL PAIN 05/05/2008  . ABDOMINAL PAIN OTHER SPECIFIED SITE 03/23/2009  . ALLERGIC  RHINITIS 08/21/2006  . CERVICAL LYMPHADENOPATHY 06/08/2009  . DEPRESSION 08/21/2006  . Diarrhea 03/23/2009  . HEMATOCHEZIA 05/10/2010  . HEMORRHOIDS 03/23/2009  . HEMORRHOIDS-EXTERNAL 03/23/2009  . INSOMNIA, TRANSIENT 07/26/2010    Past Surgical History:  Procedure Laterality Date  . LEEP    . LEEP      Family History  Problem Relation Age of Onset  . Cancer Other         brain, grandmother  . Heart disease Other     SOCIAL HX: Non-smoker.  Veterinarian   Current Outpatient Medications:  .  cetirizine (ZYRTEC) 10 MG tablet, Take 10 mg by mouth daily., Disp: , Rfl:  .  levonorgestrel (MIRENA, 52 MG,) 20 MCG/24HR IUD, Mirena 20 mcg/24 hours (5 yrs) 52 mg intrauterine device  Take 1 device by intrauterine route., Disp: , Rfl:  .  sertraline (ZOLOFT) 100 MG tablet, Take one and one half tablets daily, Disp: 135 tablet, Rfl: 3 .  temazepam (RESTORIL) 15 MG capsule, TAKE 1-2 CAPSULES AT BEDTIME AS NEEDED FOR INSOMNIA, Disp: 30 capsule, Rfl: 1 .  valACYclovir (VALTREX) 500 MG tablet, Take 1 tablet (500 mg total) by mouth daily., Disp: 90 tablet, Rfl: 1  Current Facility-Administered Medications:  .  0.9 %  sodium chloride infusion, 500 mL, Intravenous, Once, Ladene Artist, MD  EXAM:  VITALS per patient if applicable:  GENERAL: alert, oriented, appears well and in no acute distress  HEENT: atraumatic, conjunttiva clear, no obvious abnormalities on inspection of external nose and ears  NECK: normal movements of the head and neck  LUNGS: on inspection no signs of respiratory distress, breathing rate appears normal, no obvious gross SOB, gasping or wheezing  CV: no obvious cyanosis  MS: moves all visible extremities without noticeable abnormality  PSYCH/NEURO: pleasant and cooperative, no obvious depression or anxiety, speech and thought processing grossly intact  ASSESSMENT AND PLAN:  Discussed the following assessment and plan:  #  1 history of recurrent depression-stable -Refilled sertraline for 1 year  #2 history of recurrent cold sores -Patient requesting prophylaxis.  Will start Valtrex 500 mg once daily.  Consider discontinuation in about 4 months  #3 history of chronic insomnia -Refilled temazepam 15 mg nightly which she takes only sporadically and infrequently for severe insomnia     I discussed the assessment and treatment plan  with the patient. The patient was provided an opportunity to ask questions and all were answered. The patient agreed with the plan and demonstrated an understanding of the instructions.   The patient was advised to call back or seek an in-person evaluation if the symptoms worsen or if the condition fails to improve as anticipated.   Carolann Littler, MD

## 2019-01-21 DIAGNOSIS — Z30431 Encounter for routine checking of intrauterine contraceptive device: Secondary | ICD-10-CM | POA: Diagnosis not present

## 2019-01-21 DIAGNOSIS — Z30433 Encounter for removal and reinsertion of intrauterine contraceptive device: Secondary | ICD-10-CM | POA: Diagnosis not present

## 2019-02-09 ENCOUNTER — Ambulatory Visit (INDEPENDENT_AMBULATORY_CARE_PROVIDER_SITE_OTHER): Payer: 59 | Admitting: Family Medicine

## 2019-02-09 ENCOUNTER — Other Ambulatory Visit: Payer: Self-pay

## 2019-02-09 DIAGNOSIS — R21 Rash and other nonspecific skin eruption: Secondary | ICD-10-CM | POA: Diagnosis not present

## 2019-02-09 NOTE — Progress Notes (Signed)
Patient ID: [name], female   DOB: Jun 23, 1971, 48 y.o.   MRN: [id number]  This visit type was conducted due to national recommendations for restrictions regarding the COVID-19 pandemic in an effort to limit this patient's exposure and mitigate transmission in our community.   Virtual Visit via Video Note  I connected with [name] on 02/09/19 at 10:45 AM EDT by a video enabled telemedicine application and verified that I am speaking with the correct person using two identifiers.  Location patient: home Location provider:work or home office Persons participating in the virtual visit: patient, provider  I discussed the limitations of evaluation and management by telemedicine and the availability of in person appointments. The patient expressed understanding and agreed to proceed.   HPI:  Patient relates 1 day history of possible petechiae on her right hand and wrist dorsally.  She has not had any associated pain or itching.  No vesicles.  Nonscaly.  Denies any other petechiae systemically or any spontaneous ecchymosis or other bleeding complications.  Denies any history of recent trauma or edema.  Rash was noted incidentally this morning.  She does not have any systemic issues such as fever, chills, cough, dyspnea  ROS: See pertinent positives and negatives per HPI.  Past Medical History:  Diagnosis Date  . ABDOMINAL PAIN 05/05/2008  . ABDOMINAL PAIN OTHER SPECIFIED SITE 03/23/2009  . ALLERGIC  RHINITIS 08/21/2006  . CERVICAL LYMPHADENOPATHY 06/08/2009  . DEPRESSION 08/21/2006  . Diarrhea 03/23/2009  . HEMATOCHEZIA 05/10/2010  . HEMORRHOIDS 03/23/2009  . HEMORRHOIDS-EXTERNAL 03/23/2009  . INSOMNIA, TRANSIENT 07/26/2010    Past Surgical History:  Procedure Laterality Date  . LEEP    . LEEP      Family History  Problem Relation Age of Onset  . Cancer Other        brain, grandmother  . Heart disease Other     SOCIAL HX: Non-smoker   Current Outpatient Medications:  .   cetirizine (ZYRTEC) 10 MG tablet, Take 10 mg by mouth daily., Disp: , Rfl:  .  levonorgestrel (MIRENA, 52 MG,) 20 MCG/24HR IUD, Mirena 20 mcg/24 hours (5 yrs) 52 mg intrauterine device  Take 1 device by intrauterine route., Disp: , Rfl:  .  sertraline (ZOLOFT) 100 MG tablet, Take one and one half tablets daily, Disp: 135 tablet, Rfl: 3 .  temazepam (RESTORIL) 15 MG capsule, TAKE 1-2 CAPSULES AT BEDTIME AS NEEDED FOR INSOMNIA, Disp: 30 capsule, Rfl: 1 .  valACYclovir (VALTREX) 500 MG tablet, Take 1 tablet (500 mg total) by mouth daily., Disp: 90 tablet, Rfl: 1  Current Facility-Administered Medications:  .  0.9 %  sodium chloride infusion, 500 mL, Intravenous, Once, Ladene Artist, MD  EXAM:  VITALS per patient if applicable:  GENERAL: alert, oriented, appears well and in no acute distress  HEENT: atraumatic, conjunttiva clear, no obvious abnormalities on inspection of external nose and ears  NECK: normal movements of the head and neck  LUNGS: on inspection no signs of respiratory distress, breathing rate appears normal, no obvious gross SOB, gasping or wheezing  CV: no obvious cyanosis  MS: moves all visible extremities without noticeable abnormality  PSYCH/NEURO: pleasant and cooperative, no obvious depression or anxiety, speech and thought processing grossly intact  Skin-rash was somewhat washed out by light and difficult to see on the video monitor.  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  These appear to be very localized.  She does not have any other worrisome symptoms.  Given the fact these are very localized question whether they may be related to recent inadvertent trauma or pressure to this region  -We recommended observation for now -Be in touch for any generalized or progressive petechiae or any new  symptoms -Consider CBC and further evaluation if these are persisting or if any new lesions noted     I discussed the assessment and treatment plan with the patient. The patient was provided an opportunity to ask questions and all were answered. The patient agreed with the plan and demonstrated an understanding of the instructions.   The patient was advised to call back or seek an in-person evaluation if the symptoms worsen or if the condition fails to improve as anticipated.     Carolann Littler, MD

## 2019-07-21 ENCOUNTER — Other Ambulatory Visit: Payer: Self-pay | Admitting: Family Medicine

## 2019-08-20 ENCOUNTER — Other Ambulatory Visit: Payer: Self-pay | Admitting: Family Medicine

## 2019-08-30 ENCOUNTER — Other Ambulatory Visit: Payer: Self-pay

## 2019-08-31 ENCOUNTER — Other Ambulatory Visit: Payer: Self-pay

## 2019-08-31 ENCOUNTER — Ambulatory Visit: Payer: 59 | Admitting: Family Medicine

## 2019-08-31 ENCOUNTER — Encounter: Payer: Self-pay | Admitting: Family Medicine

## 2019-08-31 VITALS — BP 122/80 | HR 83 | Temp 97.7°F | Ht 66.0 in | Wt 137.4 lb

## 2019-08-31 DIAGNOSIS — F339 Major depressive disorder, recurrent, unspecified: Secondary | ICD-10-CM

## 2019-08-31 DIAGNOSIS — G4700 Insomnia, unspecified: Secondary | ICD-10-CM

## 2019-08-31 DIAGNOSIS — B001 Herpesviral vesicular dermatitis: Secondary | ICD-10-CM | POA: Diagnosis not present

## 2019-08-31 MED ORDER — SERTRALINE HCL 100 MG PO TABS: ORAL_TABLET | ORAL | 3 refills | Status: DC

## 2019-08-31 MED ORDER — VALACYCLOVIR HCL 1 G PO TABS: ORAL_TABLET | ORAL | 1 refills | Status: DC

## 2019-08-31 NOTE — Progress Notes (Signed)
Subjective:     Patient ID: [name], female   DOB: December 30, 1970, 48 y.o.   MRN: [id number]  HPI [name] is here to discuss the following issues  She has longstanding history of anxiety and recurrent depression.  She states that her mom and sister had similar issues.  She is perimenopausal has also had hx of premenstrual dysphoric disorder.  For years she has been on Zoloft.  She has tried other medications with less success.  She is currently on 150 mg daily and questions whether she can increase dose.  She thinks she is in the perimenopausal stage.  She is having occasional hot flashes.  She is scheduled to see her GYN in a couple months.  She currently is on Mirena.  Thyroid levels last year were normal  She has history of recurrent cold sores.  She tried prophylaxis with 500 mg daily but still had breakthrough.  At this point she is requesting going back to 1000 mg to use only as needed for acute flareups.  She has history of insomnia and takes temazepam infrequently for severe insomnia issues  Past Medical History:  Diagnosis Date  . ABDOMINAL PAIN 05/05/2008  . ABDOMINAL PAIN OTHER SPECIFIED SITE 03/23/2009  . ALLERGIC  RHINITIS 08/21/2006  . CERVICAL LYMPHADENOPATHY 06/08/2009  . DEPRESSION 08/21/2006  . Diarrhea 03/23/2009  . HEMATOCHEZIA 05/10/2010  . HEMORRHOIDS 03/23/2009  . HEMORRHOIDS-EXTERNAL 03/23/2009  . INSOMNIA, TRANSIENT 07/26/2010   Past Surgical History:  Procedure Laterality Date  . LEEP    . LEEP      reports that she has never smoked. She has never used smokeless tobacco. She reports current alcohol use. She reports that she does not use drugs. family history includes Cancer in an other family member; Heart disease in an other family member. Allergies  Allergen Reactions  . Codeine Nausea And Vomiting     Review of Systems  Constitutional: Negative for appetite change, fatigue and unexpected weight change.  Eyes: Negative for visual disturbance.   Respiratory: Negative for cough, chest tightness, shortness of breath and wheezing.   Cardiovascular: Negative for chest pain, palpitations and leg swelling.  Endocrine: Negative for cold intolerance, heat intolerance, polydipsia and polyuria.  Genitourinary: Negative for dysuria.  Neurological: Negative for dizziness, seizures, syncope, weakness, light-headedness and headaches.       Objective:   Physical Exam Vitals signs reviewed.  Constitutional:      Appearance: Normal appearance.  Cardiovascular:     Rate and Rhythm: Normal rate and regular rhythm.  Pulmonary:     Effort: Pulmonary effort is normal.     Breath sounds: Normal breath sounds.  Neurological:     Mental Status: She is alert.  Psychiatric:        Mood and Affect: Mood normal.        Behavior: Behavior normal.        Assessment:     #1 recurrent cold sores  #2 history of chronic anxiety and depression  #3 history of intermittent insomnia    Plan:     -Patient requesting increase in sertraline.  We explained that maximum dose is 200 mg.  We will go up to 200 mg but will take great care to avoid other serotonin type medications -She will give me some feedback if not seeing improvement over the next few weeks -Refilled Valtrex 1000 mg to take 2 at onset of cold sore and repeat 2 in 12 hours -Continue infrequent use of temazepam for severe insomnia -She  plans to discuss hormone replacement issues further with her [place] [facility]

## 2019-09-27 ENCOUNTER — Other Ambulatory Visit: Payer: Self-pay | Admitting: Family Medicine

## 2019-10-13 ENCOUNTER — Other Ambulatory Visit: Payer: 59

## 2019-11-13 ENCOUNTER — Other Ambulatory Visit: Payer: Self-pay | Admitting: Family Medicine

## 2020-01-27 ENCOUNTER — Other Ambulatory Visit: Payer: Self-pay | Admitting: Family Medicine

## 2020-06-14 ENCOUNTER — Other Ambulatory Visit: Payer: Self-pay | Admitting: Family Medicine

## 2020-09-10 ENCOUNTER — Other Ambulatory Visit: Payer: Self-pay | Admitting: Family Medicine

## 2020-09-26 ENCOUNTER — Other Ambulatory Visit: Payer: Self-pay

## 2020-09-26 ENCOUNTER — Encounter: Payer: Self-pay | Admitting: Family Medicine

## 2020-09-26 ENCOUNTER — Ambulatory Visit: Payer: 59 | Admitting: Family Medicine

## 2020-09-26 VITALS — BP 120/80 | HR 70 | Ht 66.0 in | Wt 137.0 lb

## 2020-09-26 DIAGNOSIS — N939 Abnormal uterine and vaginal bleeding, unspecified: Secondary | ICD-10-CM | POA: Insufficient documentation

## 2020-09-26 DIAGNOSIS — N979 Female infertility, unspecified: Secondary | ICD-10-CM | POA: Insufficient documentation

## 2020-09-26 DIAGNOSIS — Z0000 Encounter for general adult medical examination without abnormal findings: Secondary | ICD-10-CM

## 2020-09-26 DIAGNOSIS — B009 Herpesviral infection, unspecified: Secondary | ICD-10-CM | POA: Insufficient documentation

## 2020-09-26 DIAGNOSIS — F419 Anxiety disorder, unspecified: Secondary | ICD-10-CM | POA: Insufficient documentation

## 2020-09-26 DIAGNOSIS — R87619 Unspecified abnormal cytological findings in specimens from cervix uteri: Secondary | ICD-10-CM | POA: Insufficient documentation

## 2020-09-26 DIAGNOSIS — F329 Major depressive disorder, single episode, unspecified: Secondary | ICD-10-CM | POA: Insufficient documentation

## 2020-09-26 DIAGNOSIS — F32A Depression, unspecified: Secondary | ICD-10-CM | POA: Insufficient documentation

## 2020-09-26 DIAGNOSIS — N907 Vulvar cyst: Secondary | ICD-10-CM | POA: Insufficient documentation

## 2020-09-26 DIAGNOSIS — A749 Chlamydial infection, unspecified: Secondary | ICD-10-CM | POA: Insufficient documentation

## 2020-09-26 MED ORDER — TEMAZEPAM 15 MG PO CAPS: ORAL_CAPSULE | ORAL | 0 refills | Status: DC

## 2020-09-26 MED ORDER — SERTRALINE HCL 100 MG PO TABS: ORAL_TABLET | ORAL | 3 refills | Status: DC

## 2020-09-26 MED ORDER — VALACYCLOVIR HCL 1 G PO TABS: ORAL_TABLET | ORAL | 1 refills | Status: DC

## 2020-09-26 NOTE — Patient Instructions (Signed)

## 2020-09-26 NOTE — Progress Notes (Signed)
Established Patient Office Visit  Subjective:  Patient ID: [name], female    DOB: [birth year]  Age: 49 y.o. MRN: [id number]  CC:  Chief Complaint  Patient presents with  . Annual Exam    HPI [name] presents for physical exam.  [name] has history of some intermittent insomnia and chronic anxiety symptoms.  She takes high-dose sertraline 200 mg daily and has been on this regimen for quite some time.  She has intermittent cold sores and takes Valtrex for that.  She is requesting vitamin D level today.  She has had some recent mild sore throat symptoms and right cervical adenopathy which appears to be improving.  No fever.  Health maintenance reviewed  -Flu vaccine already given -She sees gynecologist for Pap smears and mammograms. -Tetanus due 2027 -Covid vaccines already given -She had previous colonoscopy with recommended repeat 8/22  Family history-parents are alive and well.  She has total of 5 siblings.  She had a couple of brothers and sisters with depression history.  No known family history of type 2 diabetes, hypertension, cancer, or premature CAD.  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  Non-smoker.  Only rare alcohol about 2-3 times per month.  Has 74 year old son.   Past Medical History:  Diagnosis Date  . ABDOMINAL PAIN 05/05/2008  . ABDOMINAL PAIN OTHER SPECIFIED SITE 03/23/2009  . ALLERGIC  RHINITIS 08/21/2006  . CERVICAL LYMPHADENOPATHY 06/08/2009  . DEPRESSION 08/21/2006  . Diarrhea 03/23/2009  . HEMATOCHEZIA 05/10/2010  . HEMORRHOIDS 03/23/2009  . HEMORRHOIDS-EXTERNAL 03/23/2009  . INSOMNIA, TRANSIENT 07/26/2010    Past Surgical History:  Procedure Laterality Date  . LEEP    . LEEP      Family History  Problem Relation Age of Onset  . Cancer Other        brain, grandmother  . Heart disease Other   . Depression Sister   . Depression Brother     Social History   Socioeconomic History  . Marital status: Married    Spouse name:  Not on file  . Number of children: 1  . Years of education: Not on file  . Highest education level: Not on file  Occupational History  . Not on file  Tobacco Use  . Smoking status: Never Smoker  . Smokeless tobacco: Never Used  Vaping Use  . Vaping Use: Never used  Substance and Sexual Activity  . Alcohol use: Yes    Comment: Occasional  . Drug use: No  . Sexual activity: Yes  Other Topics Concern  . Not on file  Social History Narrative  . Not on file   Social Determinants of Health   Financial Resource Strain: Not on file  Food Insecurity: Not on file  Transportation Needs: Not on file  Physical Activity: Not on file  Stress: Not on file  Social Connections: Not on file  Intimate Partner Violence: Not on file    Outpatient Medications Prior to Visit  Medication Sig Dispense Refill  . cetirizine (ZYRTEC) 10 MG tablet Take 10 mg by mouth daily.    Marland Kitchen levonorgestrel (MIRENA, 52 MG,) 20 MCG/24HR IUD Mirena 20 mcg/24 hours (5 yrs) 52 mg intrauterine device  Take 1 device by intrauterine route.    . sertraline (ZOLOFT) 100 MG tablet TAKE TWO TABLETS BY MOUTH DAILY 180 tablet 3  . temazepam (RESTORIL) 15 MG capsule TAKE 1 TO 2 CAPSULES BY MOUTH EVERY NIGHT AT BEDTIME AS NEEDED FOR INSOMNIA 30 capsule 0  . valACYclovir (VALTREX)  1000 MG tablet Take two at onset of cold sore and repeat two in 12 hours. 60 tablet 1   Facility-Administered Medications Prior to Visit  Medication Dose Route Frequency Provider Last Rate Last Admin  . 0.9 %  sodium chloride infusion  500 mL Intravenous Once Ladene Artist, MD        Allergies  Allergen Reactions  . Codeine Nausea And Vomiting    ROS Review of Systems  Constitutional: Negative for activity change, appetite change, fatigue, fever and unexpected weight change.  HENT: Negative for ear pain, hearing loss, sore throat and trouble swallowing.   Eyes: Negative for visual disturbance.  Respiratory: Negative for cough and shortness  of breath.   Cardiovascular: Negative for chest pain and palpitations.  Gastrointestinal: Negative for abdominal pain, blood in stool, constipation and diarrhea.  Endocrine: Negative for polydipsia and polyuria.  Genitourinary: Negative for dysuria and hematuria.  Musculoskeletal: Negative for arthralgias, back pain and myalgias.  Skin: Negative for rash.  Neurological: Negative for dizziness, syncope and headaches.  Hematological: Negative for adenopathy.  Psychiatric/Behavioral: Positive for sleep disturbance. Negative for confusion and dysphoric mood. The patient is nervous/anxious.       Objective:    Physical Exam Constitutional:      Appearance: She is well-developed and well-nourished.  HENT:     Head: Normocephalic and atraumatic.     Mouth/Throat:     Mouth: Mucous membranes are moist.     Pharynx: Oropharynx is clear. No oropharyngeal exudate or posterior oropharyngeal erythema.  Eyes:     Extraocular Movements: EOM normal.     Pupils: Pupils are equal, round, and reactive to light.  Neck:     Thyroid: No thyromegaly.     Comments: She has a couple of palpable right anterior cervical triangle nodes which are slightly tender. Cardiovascular:     Rate and Rhythm: Normal rate and regular rhythm.     Heart sounds: Normal heart sounds. No murmur heard.   Pulmonary:     Effort: No respiratory distress.     Breath sounds: Normal breath sounds. No wheezing or rales.  Abdominal:     General: Bowel sounds are normal. There is no distension.     Palpations: Abdomen is soft. There is no mass.     Tenderness: There is no abdominal tenderness. There is no guarding or rebound.  Musculoskeletal:        General: No edema. Normal range of motion.     Cervical back: Normal range of motion and neck supple.  Skin:    Findings: No rash.  Neurological:     Mental Status: She is alert and oriented to person, place, and time.     Cranial Nerves: No cranial nerve deficit.     Deep  Tendon Reflexes: Reflexes normal.  Psychiatric:        Mood and Affect: Mood and affect normal.        Behavior: Behavior normal.        Thought Content: Thought content normal.        Judgment: Judgment normal.     BP 120/80 (BP Location: Left Arm, Cuff Size: Normal)   Pulse 70   Ht 5\' 6"  (1.676 m)   Wt 137 lb (62.1 kg)   SpO2 98%   BMI 22.11 kg/m  Wt Readings from Last 3 Encounters:  09/26/20 137 lb (62.1 kg)  08/31/19 137 lb 6.4 oz (62.3 kg)  10/28/18 127 lb 9.6 oz (57.9 kg)  Health Maintenance Due  Topic Date Due  . Hepatitis C Screening  Never done  . HIV Screening  Never done  . PAP SMEAR-Modifier  08/16/2019    There are no preventive care reminders to display for this patient.  Lab Results  Component Value Date   TSH 1.27 02/26/2018   Lab Results  Component Value Date   WBC 6.0 02/26/2018   HGB 13.5 02/26/2018   HCT 40.3 02/26/2018   MCV 89.4 02/26/2018   PLT 224.0 02/26/2018   Lab Results  Component Value Date   NA 139 02/26/2018   K 3.7 02/26/2018   CO2 29 02/26/2018   GLUCOSE 86 02/26/2018   BUN 10 02/26/2018   CREATININE 0.83 02/26/2018   BILITOT 0.5 02/26/2018   ALKPHOS 64 02/26/2018   AST 13 02/26/2018   ALT 8 02/26/2018   PROT 7.7 02/26/2018   ALBUMIN 4.7 02/26/2018   CALCIUM 9.9 02/26/2018   GFR 78.31 02/26/2018   Lab Results  Component Value Date   CHOL 239 (H) 02/26/2018   Lab Results  Component Value Date   HDL 77.20 02/26/2018   Lab Results  Component Value Date   LDLCALC 144 (H) 02/26/2018   Lab Results  Component Value Date   TRIG 89.0 02/26/2018   Lab Results  Component Value Date   CHOLHDL 3 02/26/2018   No results found for: HGBA1C    Assessment & Plan:   Problem List Items Addressed This Visit   None   Visit Diagnoses    Physical exam    -  Primary   Relevant Orders   Basic metabolic panel   Lipid panel   CBC with Differential/Platelet   TSH   Hepatic function panel   Hep C Antibody    VITAMIN D 25 Hydroxy (Vit-D Deficiency, Fractures)    -Vaccines up-to-date -Consider shingles vaccine at age 61 -Obtain screening labs as above.  Patient requesting vitamin D level.  Also check hepatitis C antibody.  She is low risk. -Initial blood pressure was elevated but repeat after rest normal  Meds ordered this encounter  Medications  . valACYclovir (VALTREX) 1000 MG tablet    Sig: Take two at onset of cold sore and repeat two in 12 hours.    Dispense:  60 tablet    Refill:  1  . temazepam (RESTORIL) 15 MG capsule    Sig: TAKE 1 TO 2 CAPSULES BY MOUTH EVERY NIGHT AT BEDTIME AS NEEDED FOR INSOMNIA    Dispense:  30 capsule    Refill:  0  . sertraline (ZOLOFT) 100 MG tablet    Sig: TAKE TWO TABLETS BY MOUTH DAILY    Dispense:  180 tablet    Refill:  3    Follow-up: No follow-ups on file.    Carolann Littler, MD

## 2020-09-27 LAB — CBC WITH DIFFERENTIAL/PLATELET
Absolute Monocytes: 348 cells/uL (ref 200–950)
Basophils Absolute: 30 cells/uL (ref 0–200)
Basophils Relative: 0.5 %
Eosinophils Absolute: 12 cells/uL — ABNORMAL LOW (ref 15–500)
Eosinophils Relative: 0.2 %
HCT: 40 % (ref 35.0–45.0)
Hemoglobin: 13.6 g/dL (ref 11.7–15.5)
Lymphs Abs: 1152 cells/uL (ref 850–3900)
MCH: 29.6 pg (ref 27.0–33.0)
MCHC: 34 g/dL (ref 32.0–36.0)
MCV: 87.1 fL (ref 80.0–100.0)
MPV: 9.8 fL (ref 7.5–12.5)
Monocytes Relative: 5.8 %
Neutro Abs: 4458 cells/uL (ref 1500–7800)
Neutrophils Relative %: 74.3 %
Platelets: 231 10*3/uL (ref 140–400)
RBC: 4.59 10*6/uL (ref 3.80–5.10)
RDW: 13.9 % (ref 11.0–15.0)
Total Lymphocyte: 19.2 %
WBC: 6 10*3/uL (ref 3.8–10.8)

## 2020-09-27 LAB — HEPATIC FUNCTION PANEL
AG Ratio: 1.6 (calc) (ref 1.0–2.5)
ALT: 8 U/L (ref 6–29)
AST: 16 U/L (ref 10–35)
Albumin: 4.5 g/dL (ref 3.6–5.1)
Alkaline phosphatase (APISO): 64 U/L (ref 31–125)
Bilirubin, Direct: 0.1 mg/dL (ref 0.0–0.2)
Globulin: 2.8 g/dL (calc) (ref 1.9–3.7)
Indirect Bilirubin: 0.4 mg/dL (calc) (ref 0.2–1.2)
Total Bilirubin: 0.5 mg/dL (ref 0.2–1.2)
Total Protein: 7.3 g/dL (ref 6.1–8.1)

## 2020-09-27 LAB — BASIC METABOLIC PANEL
BUN: 13 mg/dL (ref 7–25)
CO2: 24 mmol/L (ref 20–32)
Calcium: 9.4 mg/dL (ref 8.6–10.2)
Chloride: 101 mmol/L (ref 98–110)
Creat: 0.84 mg/dL (ref 0.50–1.10)
Glucose, Bld: 82 mg/dL (ref 65–99)
Potassium: 4.2 mmol/L (ref 3.5–5.3)
Sodium: 137 mmol/L (ref 135–146)

## 2020-09-27 LAB — VITAMIN D 25 HYDROXY (VIT D DEFICIENCY, FRACTURES): Vit D, 25-Hydroxy: 26 ng/mL — ABNORMAL LOW (ref 30–100)

## 2020-09-27 LAB — HEPATITIS C ANTIBODY
Hepatitis C Ab: NONREACTIVE
SIGNAL TO CUT-OFF: 0.01 (ref <1.00)

## 2020-09-27 LAB — LIPID PANEL
Cholesterol: 312 mg/dL — ABNORMAL HIGH (ref <200)
HDL: 78 mg/dL (ref >=50)
LDL Cholesterol (Calc): 211 mg/dL (calc) — ABNORMAL HIGH
Non-HDL Cholesterol (Calc): 234 mg/dL (calc) — ABNORMAL HIGH (ref <130)
Total CHOL/HDL Ratio: 4 (calc) (ref <5.0)
Triglycerides: 100 mg/dL (ref <150)

## 2020-09-27 LAB — TSH: TSH: 1.7 mIU/L

## 2021-02-25 ENCOUNTER — Other Ambulatory Visit: Payer: Self-pay | Admitting: Family Medicine

## 2021-02-26 NOTE — Telephone Encounter (Signed)
Last filled 09/26/2020 Last OV 09/26/2020  Ok to fill?

## 2021-04-05 DIAGNOSIS — G4710 Hypersomnia, unspecified: Secondary | ICD-10-CM | POA: Diagnosis not present

## 2021-04-12 DIAGNOSIS — G4733 Obstructive sleep apnea (adult) (pediatric): Secondary | ICD-10-CM | POA: Diagnosis not present

## 2021-05-10 DIAGNOSIS — N959 Unspecified menopausal and perimenopausal disorder: Secondary | ICD-10-CM | POA: Diagnosis not present

## 2021-05-10 DIAGNOSIS — N6320 Unspecified lump in the left breast, unspecified quadrant: Secondary | ICD-10-CM | POA: Diagnosis not present

## 2021-05-10 DIAGNOSIS — N393 Stress incontinence (female) (male): Secondary | ICD-10-CM | POA: Diagnosis not present

## 2021-05-14 ENCOUNTER — Other Ambulatory Visit: Payer: Self-pay | Admitting: Obstetrics and Gynecology

## 2021-05-14 DIAGNOSIS — R928 Other abnormal and inconclusive findings on diagnostic imaging of breast: Secondary | ICD-10-CM

## 2021-05-15 ENCOUNTER — Other Ambulatory Visit: Payer: Self-pay | Admitting: Obstetrics and Gynecology

## 2021-05-15 DIAGNOSIS — N63 Unspecified lump in unspecified breast: Secondary | ICD-10-CM

## 2021-05-18 ENCOUNTER — Encounter: Payer: Self-pay | Admitting: Gastroenterology

## 2021-06-15 ENCOUNTER — Ambulatory Visit
Admission: RE | Admit: 2021-06-15 | Discharge: 2021-06-15 | Disposition: A | Discharge status: Home / Self Care | Payer: BC Managed Care – PPO | Source: Ambulatory Visit | Attending: Obstetrics and Gynecology | Admitting: Obstetrics and Gynecology

## 2021-06-15 ENCOUNTER — Other Ambulatory Visit: Payer: Self-pay

## 2021-06-15 DIAGNOSIS — N63 Unspecified lump in unspecified breast: Secondary | ICD-10-CM

## 2021-06-15 DIAGNOSIS — R922 Inconclusive mammogram: Secondary | ICD-10-CM | POA: Diagnosis not present

## 2021-06-15 DIAGNOSIS — R928 Other abnormal and inconclusive findings on diagnostic imaging of breast: Secondary | ICD-10-CM

## 2021-06-27 DIAGNOSIS — D225 Melanocytic nevi of trunk: Secondary | ICD-10-CM | POA: Diagnosis not present

## 2021-06-27 DIAGNOSIS — L578 Other skin changes due to chronic exposure to nonionizing radiation: Secondary | ICD-10-CM | POA: Diagnosis not present

## 2021-06-27 DIAGNOSIS — L821 Other seborrheic keratosis: Secondary | ICD-10-CM | POA: Diagnosis not present

## 2021-06-27 DIAGNOSIS — L814 Other melanin hyperpigmentation: Secondary | ICD-10-CM | POA: Diagnosis not present

## 2021-07-23 ENCOUNTER — Other Ambulatory Visit: Payer: Self-pay | Admitting: Family Medicine

## 2021-10-09 ENCOUNTER — Ambulatory Visit (INDEPENDENT_AMBULATORY_CARE_PROVIDER_SITE_OTHER): Payer: BC Managed Care – PPO | Admitting: Family Medicine

## 2021-10-09 VITALS — BP 120/70 | HR 87 | Temp 98.7°F | Ht 66.0 in | Wt 145.7 lb

## 2021-10-09 DIAGNOSIS — Z0000 Encounter for general adult medical examination without abnormal findings: Secondary | ICD-10-CM | POA: Diagnosis not present

## 2021-10-09 MED ORDER — SERTRALINE HCL 100 MG PO TABS: ORAL_TABLET | ORAL | 3 refills | Status: DC

## 2021-10-09 MED ORDER — TEMAZEPAM 15 MG PO CAPS: ORAL_CAPSULE | ORAL | 3 refills | Status: DC

## 2021-10-09 MED ORDER — VALACYCLOVIR HCL 1 G PO TABS: ORAL_TABLET | ORAL | 1 refills | Status: DC

## 2021-10-09 NOTE — Progress Notes (Deleted)
The 10-year ASCVD risk score (Arnett DK, et al., 2019) is: 1.3%   Values used to calculate the score:     Age: 51 years     Sex: Female     Is Non-Hispanic African American: No     Diabetic: No     Tobacco smoker: No     Systolic Blood Pressure: 800 mmHg     Is BP treated: No     HDL Cholesterol: 78 mg/dL     Total Cholesterol: 312 mg/dL

## 2021-10-09 NOTE — Patient Instructions (Signed)
Set up repeat colonoscopy  Consider Shingrix vaccine at some point this year.

## 2021-10-09 NOTE — Progress Notes (Signed)
Established Patient Office Visit  Subjective:  Patient ID: [name], female    DOB: [birth year]  Age: 51 y.o. MRN: [id number]  CC:  Chief Complaint  Patient presents with   Annual Exam    HPI TIMEA BREED presents for physical exam.  Her chronic medical problems include history of chronic anxiety, intermittent insomnia, recurrent cold sores.  She is requesting refills of sertraline, Valtrex, and temazepam.  She is taking high-dose sertraline 200 mg daily for years.  Anxiety symptoms relatively stable.  She uses temazepam as needed.  Health maintenance reviewed  -She is due for 3-year follow-up colonoscopy.  She plans to set that up soon. -Just turned 50.  No history of Shingrix -Tetanus due 2027 -She states her Pap smear and mammogram are up-to-date.  We did not have specific dates from those but they were done last year  Family history-reviewed with no significant changes from last year  Social history-she is divorced.  She has 36 year old son.  Works as a Animal nutritionist.  Non-smoker.  Rare alcohol.  Past Medical History:  Diagnosis Date   ABDOMINAL PAIN 05/05/2008   ABDOMINAL PAIN OTHER SPECIFIED SITE 03/23/2009   ALLERGIC  RHINITIS 08/21/2006   CERVICAL LYMPHADENOPATHY 06/08/2009   DEPRESSION 08/21/2006   Diarrhea 03/23/2009   HEMATOCHEZIA 05/10/2010   HEMORRHOIDS 03/23/2009   HEMORRHOIDS-EXTERNAL 03/23/2009   INSOMNIA, TRANSIENT 07/26/2010    Past Surgical History:  Procedure Laterality Date   LEEP     LEEP      Family History  Problem Relation Age of Onset   Cancer Other        brain, grandmother   Heart disease Other    Depression Sister    Depression Brother     Social History   Socioeconomic History   Marital status: Married    Spouse name: Not on file   Number of children: 1   Years of education: Not on file   Highest education level: Not on file  Occupational History   Not on file  Tobacco Use   Smoking status: Never   Smokeless tobacco:  Never  Vaping Use   Vaping Use: Never used  Substance and Sexual Activity   Alcohol use: Yes    Comment: Occasional   Drug use: No   Sexual activity: Yes  Other Topics Concern   Not on file  Social History Narrative   Not on file   Social Determinants of Health   Financial Resource Strain: Not on file  Food Insecurity: Not on file  Transportation Needs: Not on file  Physical Activity: Not on file  Stress: Not on file  Social Connections: Not on file  Intimate Partner Violence: Not on file    Outpatient Medications Prior to Visit  Medication Sig Dispense Refill   cetirizine (ZYRTEC) 10 MG tablet Take 10 mg by mouth daily.     levonorgestrel (MIRENA) 20 MCG/24HR IUD Mirena 20 mcg/24 hours (5 yrs) 52 mg intrauterine device  Take 1 device by intrauterine route.     sertraline (ZOLOFT) 100 MG tablet TAKE TWO TABLETS BY MOUTH DAILY 180 tablet 3   temazepam (RESTORIL) 15 MG capsule TAKE 1 TO 2 CAPSULES BY MOUTH EVERY NIGHT AT BEDTIME AS NEEDED FOR INSOMNIA 30 capsule 3   valACYclovir (VALTREX) 1000 MG tablet Take two at onset of cold sore and repeat two in 12 hours. 60 tablet 1   Facility-Administered Medications Prior to Visit  Medication Dose Route Frequency Provider Last Rate Last Admin  0.9 %  sodium chloride infusion  500 mL Intravenous Once Ladene Artist, MD        Allergies  Allergen Reactions   Codeine Nausea And Vomiting    ROS Review of Systems  Constitutional:  Negative for activity change, appetite change, fatigue, fever and unexpected weight change.  HENT:  Negative for ear pain, hearing loss, sore throat and trouble swallowing.   Eyes:  Negative for visual disturbance.  Respiratory:  Negative for cough and shortness of breath.   Cardiovascular:  Negative for chest pain and palpitations.  Gastrointestinal:  Negative for abdominal pain, blood in stool, constipation and diarrhea.  Genitourinary:  Negative for dysuria and hematuria.  Musculoskeletal:   Negative for arthralgias, back pain and myalgias.  Skin:  Negative for rash.  Neurological:  Negative for dizziness, syncope and headaches.  Hematological:  Negative for adenopathy.  Psychiatric/Behavioral:  Negative for confusion and dysphoric mood.      Objective:    Physical Exam Vitals reviewed.  Constitutional:      Appearance: Normal appearance.  HENT:     Right Ear: Tympanic membrane normal.     Left Ear: Tympanic membrane normal.  Cardiovascular:     Rate and Rhythm: Normal rate and regular rhythm.     Heart sounds: No murmur heard.   No gallop.  Pulmonary:     Effort: Pulmonary effort is normal.     Breath sounds: Normal breath sounds. No wheezing or rales.  Musculoskeletal:     Cervical back: Neck supple.     Right lower leg: No edema.     Left lower leg: No edema.  Lymphadenopathy:     Cervical: No cervical adenopathy.  Neurological:     General: No focal deficit present.     Mental Status: She is alert.    BP 120/70 (BP Location: Left Arm, Patient Position: Sitting, Cuff Size: Normal)    Pulse 87    Temp 98.7 F (37.1 C) (Oral)    Ht 5\' 6"  (1.676 m)    Wt 145 lb 11.2 oz (66.1 kg)    SpO2 98%    BMI 23.52 kg/m  Wt Readings from Last 3 Encounters:  10/09/21 145 lb 11.2 oz (66.1 kg)  09/26/20 137 lb (62.1 kg)  08/31/19 137 lb 6.4 oz (62.3 kg)     Health Maintenance Due  Topic Date Due   HIV Screening  Never done   PAP SMEAR-Modifier  08/16/2019   COVID-19 Vaccine (4 - Booster for Moderna series) 11/02/2020   MAMMOGRAM  02/19/2021   Zoster Vaccines- Shingrix (1 of 2) Never done   COLONOSCOPY (Pts 45-36yrs Insurance coverage will need to be confirmed)  05/07/2021    There are no preventive care reminders to display for this patient.  Lab Results  Component Value Date   TSH 1.70 09/26/2020   Lab Results  Component Value Date   WBC 6.0 09/26/2020   HGB 13.6 09/26/2020   HCT 40.0 09/26/2020   MCV 87.1 09/26/2020   PLT 231 09/26/2020   Lab  Results  Component Value Date   NA 137 09/26/2020   K 4.2 09/26/2020   CO2 24 09/26/2020   GLUCOSE 82 09/26/2020   BUN 13 09/26/2020   CREATININE 0.84 09/26/2020   BILITOT 0.5 09/26/2020   ALKPHOS 64 02/26/2018   AST 16 09/26/2020   ALT 8 09/26/2020   PROT 7.3 09/26/2020   ALBUMIN 4.7 02/26/2018   CALCIUM 9.4 09/26/2020   GFR 78.31 02/26/2018  Lab Results  Component Value Date   CHOL 312 (H) 09/26/2020   Lab Results  Component Value Date   HDL 78 09/26/2020   Lab Results  Component Value Date   LDLCALC 211 (H) 09/26/2020   Lab Results  Component Value Date   TRIG 100 09/26/2020   Lab Results  Component Value Date   CHOLHDL 4.0 09/26/2020   No results found for: HGBA1C    Assessment & Plan:   Physical exam.  Patient requesting refills of sertraline and Valtrex.  Also uses temazepam as needed.  These refills were provided.  We discussed the following health maintenance issues  -Consider Shingrix vaccine at some point this year -She is strongly urged to set up repeat colonoscopy as 3-year follow-up due at this time -Offered screening labs but she was unable to stay for labs at this time.  She will consider by next year. -She plans to continue GYN follow-up for Pap smears and mammograms.  Meds ordered this encounter  Medications   sertraline (ZOLOFT) 100 MG tablet    Sig: TAKE TWO TABLETS BY MOUTH DAILY    Dispense:  180 tablet    Refill:  3   temazepam (RESTORIL) 15 MG capsule    Sig: Take one capsule by mouth qhs prn insomnia    Dispense:  30 capsule    Refill:  3   valACYclovir (VALTREX) 1000 MG tablet    Sig: Take two at onset of cold sore and repeat two in 12 hours.    Dispense:  60 tablet    Refill:  1    Follow-up: No follow-ups on file.    Carolann Littler, MD

## 2021-11-29 ENCOUNTER — Ambulatory Visit: Payer: BC Managed Care – PPO | Admitting: Gastroenterology

## 2021-11-29 ENCOUNTER — Encounter: Payer: Self-pay | Admitting: Gastroenterology

## 2021-11-29 VITALS — BP 122/90 | HR 78 | Ht 66.0 in | Wt 144.4 lb

## 2021-11-29 DIAGNOSIS — K921 Melena: Secondary | ICD-10-CM | POA: Diagnosis not present

## 2021-11-29 DIAGNOSIS — Z86010 Personal history of colonic polyps: Secondary | ICD-10-CM | POA: Diagnosis not present

## 2021-11-29 NOTE — Progress Notes (Signed)
History of Present Illness: This is a 51 year old female referred by Eulas Post, MD for the evaluation of hematochezia and a personal history of adenomatous colon polyps.  She relates that infrequent intermittent episodes of small amounts of bright red blood on the tissue paper when wiping at this is been noted for the past couple years.  She has alternating diarrhea and constipation that she relates to stress and food intolerances.  The symptoms have not changed. Denies weight loss, abdominal pain, change in stool caliber, melena, nausea, vomiting, dysphagia, reflux symptoms, chest pain.   Colonoscopy 05/2018 - One 14 mm polyp in the transverse colon, removed with a hot snare. Resected and retrieved. - One 4 mm polyp in the cecum, removed with a cold biopsy forceps. Resected and retrieved. - The examined portion of the terminal ileum was normal. - Internal hemorrhoids. - The examination was otherwise normal on direct and retroflexion views. Path: tubular adenomas   Allergies  Allergen Reactions   Codeine Nausea And Vomiting   Outpatient Medications Prior to Visit  Medication Sig Dispense Refill   cetirizine (ZYRTEC) 10 MG tablet Take 10 mg by mouth daily.     levonorgestrel (MIRENA) 20 MCG/24HR IUD Mirena 20 mcg/24 hours (5 yrs) 52 mg intrauterine device  Take 1 device by intrauterine route.     sertraline (ZOLOFT) 100 MG tablet TAKE TWO TABLETS BY MOUTH DAILY 180 tablet 3   temazepam (RESTORIL) 15 MG capsule Take one capsule by mouth qhs prn insomnia 30 capsule 3   valACYclovir (VALTREX) 1000 MG tablet Take two at onset of cold sore and repeat two in 12 hours. 60 tablet 1   Facility-Administered Medications Prior to Visit  Medication Dose Route Frequency Provider Last Rate Last Admin   0.9 %  sodium chloride infusion  500 mL Intravenous Once Ladene Artist, MD       Past Medical History:  Diagnosis Date   ABDOMINAL PAIN 05/05/2008   ABDOMINAL PAIN OTHER SPECIFIED SITE  03/23/2009   ALLERGIC  RHINITIS 08/21/2006   CERVICAL LYMPHADENOPATHY 06/08/2009   DEPRESSION 08/21/2006   Diarrhea 03/23/2009   HEMATOCHEZIA 05/10/2010   HEMORRHOIDS 03/23/2009   HEMORRHOIDS-EXTERNAL 03/23/2009   INSOMNIA, TRANSIENT 07/26/2010   Past Surgical History:  Procedure Laterality Date   LEEP     LEEP     Social History   Socioeconomic History   Marital status: Married    Spouse name: Not on file   Number of children: 1   Years of education: Not on file   Highest education level: Not on file  Occupational History   Not on file  Tobacco Use   Smoking status: Never   Smokeless tobacco: Never  Vaping Use   Vaping Use: Never used  Substance and Sexual Activity   Alcohol use: Yes    Comment: Occasional   Drug use: No   Sexual activity: Yes  Other Topics Concern   Not on file  Social History Narrative   Not on file   Social Determinants of Health   Financial Resource Strain: Not on file  Food Insecurity: Not on file  Transportation Needs: Not on file  Physical Activity: Not on file  Stress: Not on file  Social Connections: Not on file   Family History  Problem Relation Age of Onset   Cancer Other        brain, grandmother   Heart disease Other    Depression Sister    Depression Brother  Review of Systems: Pertinent positive and negative review of systems were noted in the above HPI section. All other review of systems were otherwise negative.    Physical Exam: General: Well developed, well nourished, no acute distress Head: Normocephalic and atraumatic Eyes: Sclerae anicteric, EOMI Ears: Normal auditory acuity Mouth: Not examined, mask on during Covid-19 pandemic Neck: Supple, no masses or thyromegaly Lungs: Clear throughout to auscultation Heart: Regular rate and rhythm; no murmurs, rubs or bruits Abdomen: Soft, non tender and non distended. No masses, hepatosplenomegaly or hernias noted. Normal Bowel sounds Rectal:Deferred to colonoscopy   Musculoskeletal: Symmetrical with no gross deformities  Skin: No lesions on visible extremities Pulses:  Normal pulses noted Extremities: No clubbing, cyanosis, edema or deformities noted Neurological: Alert oriented x 4, grossly nonfocal Cervical Nodes:  No significant cervical adenopathy Inguinal Nodes: No significant inguinal adenopathy Psychological:  Alert and cooperative. Normal mood and affect   Assessment and Recommendations:  Intermittent small-volume hematochezia likely due to known internal hemorrhoids. Variable bowel habits, chronic, unchanged.  Personal history of adenomatous colon polyps due for surveillance colonoscopy.  Schedule colonoscopy. The patient wants to defer this until June or July for care partner convenience. The risks (including bleeding, perforation, infection, missed lesions, medication reactions and possible hospitalization or surgery if complications occur), benefits, and alternatives to colonoscopy with possible biopsy and possible polypectomy were discussed with the patient and they consent to proceed.     cc: Eulas Post, MD [address] [place],  [place] [address]

## 2021-11-29 NOTE — Patient Instructions (Signed)
It has been recommended to you by your physician that you have a(n) colonoscopy completed. Per your request, we did not schedule the procedure(s) today. Please contact our office at [phone] should you decide to have the procedure completed. You will be scheduled for a pre-visit and procedure at that time.  The [facility] providers would like to encourage you to use [facility] to communicate with providers for non-urgent requests or questions.  Due to long hold times on the telephone, sending your provider a message by [facility] may be a faster and more efficient way to get a response.  Please allow 48 business hours for a response.  Please remember that this is for non-urgent requests.   Thank you for choosing me and [facility].  Pricilla Riffle. Dagoberto Ligas., MD., Marval Regal

## 2021-12-20 DIAGNOSIS — Z1389 Encounter for screening for other disorder: Secondary | ICD-10-CM | POA: Diagnosis not present

## 2021-12-20 DIAGNOSIS — N951 Menopausal and female climacteric states: Secondary | ICD-10-CM | POA: Diagnosis not present

## 2021-12-20 DIAGNOSIS — Z1231 Encounter for screening mammogram for malignant neoplasm of breast: Secondary | ICD-10-CM | POA: Diagnosis not present

## 2021-12-20 DIAGNOSIS — Z130 Encounter for screening for diseases of the blood and blood-forming organs and certain disorders involving the immune mechanism: Secondary | ICD-10-CM | POA: Diagnosis not present

## 2021-12-20 DIAGNOSIS — Z01419 Encounter for gynecological examination (general) (routine) without abnormal findings: Secondary | ICD-10-CM | POA: Diagnosis not present

## 2022-02-05 ENCOUNTER — Encounter: Payer: Self-pay | Admitting: Gastroenterology

## 2022-03-01 ENCOUNTER — Other Ambulatory Visit: Payer: Self-pay

## 2022-03-01 ENCOUNTER — Ambulatory Visit (AMBULATORY_SURGERY_CENTER): Payer: Self-pay

## 2022-03-01 VITALS — Ht 66.0 in | Wt 140.0 lb

## 2022-03-01 DIAGNOSIS — Z86010 Personal history of colonic polyps: Secondary | ICD-10-CM

## 2022-03-01 MED ORDER — NA SULFATE-K SULFATE-MG SULF 17.5-3.13-1.6 GM/177ML PO SOLN: 1.0000 | Freq: Once | ORAL | 0 refills | Status: AC

## 2022-03-01 NOTE — Progress Notes (Signed)
Denies allergies to eggs or soy products. Denies complication of anesthesia or sedation. Denies use of weight loss medication. Denies use of O2.   Emmi instructions given for colonoscopy.  

## 2022-04-03 ENCOUNTER — Encounter: Payer: BC Managed Care – PPO | Admitting: Gastroenterology

## 2022-05-06 ENCOUNTER — Encounter: Payer: Self-pay | Admitting: Gastroenterology

## 2022-05-09 ENCOUNTER — Ambulatory Visit (AMBULATORY_SURGERY_CENTER): Payer: BC Managed Care – PPO | Admitting: Gastroenterology

## 2022-05-09 ENCOUNTER — Encounter: Payer: Self-pay | Admitting: Gastroenterology

## 2022-05-09 VITALS — BP 110/67 | HR 64 | Temp 97.3°F | Resp 11 | Ht 66.0 in | Wt 140.0 lb

## 2022-05-09 DIAGNOSIS — Z09 Encounter for follow-up examination after completed treatment for conditions other than malignant neoplasm: Secondary | ICD-10-CM | POA: Diagnosis not present

## 2022-05-09 DIAGNOSIS — Z1211 Encounter for screening for malignant neoplasm of colon: Secondary | ICD-10-CM | POA: Diagnosis not present

## 2022-05-09 DIAGNOSIS — D125 Benign neoplasm of sigmoid colon: Secondary | ICD-10-CM

## 2022-05-09 DIAGNOSIS — D123 Benign neoplasm of transverse colon: Secondary | ICD-10-CM

## 2022-05-09 DIAGNOSIS — Z86010 Personal history of colonic polyps: Secondary | ICD-10-CM | POA: Diagnosis not present

## 2022-05-09 DIAGNOSIS — K635 Polyp of colon: Secondary | ICD-10-CM | POA: Diagnosis not present

## 2022-05-09 MED ORDER — SODIUM CHLORIDE 0.9 % IV SOLN: 500.0000 mL | Freq: Once | INTRAVENOUS | Status: DC

## 2022-05-09 NOTE — Progress Notes (Signed)
Report to PACU, RN, vss, BBS= Clear.  

## 2022-05-09 NOTE — Patient Instructions (Addendum)
Handout provided on polyps and hemorrhoids.   YOU HAD AN ENDOSCOPIC PROCEDURE TODAY AT [facility]:   Refer to the procedure report that was given to you for any specific questions about what was found during the examination.  If the procedure report does not answer your questions, please call your gastroenterologist to clarify.  If you requested that your care partner not be given the details of your procedure findings, then the procedure report has been included in a sealed envelope for you to review at your convenience later.  YOU SHOULD EXPECT: Some feelings of bloating in the abdomen. Passage of more gas than usual.  Walking can help get rid of the air that was put into your GI tract during the procedure and reduce the bloating. If you had a lower endoscopy (such as a colonoscopy or flexible sigmoidoscopy) you may notice spotting of blood in your stool or on the toilet paper. If you underwent a bowel prep for your procedure, you may not have a normal bowel movement for a few days.  Please Note:  You might notice some irritation and congestion in your nose or some drainage.  This is from the oxygen used during your procedure.  There is no need for concern and it should clear up in a day or so.  SYMPTOMS TO REPORT IMMEDIATELY:  Following lower endoscopy (colonoscopy or flexible sigmoidoscopy):  Excessive amounts of blood in the stool  Significant tenderness or worsening of abdominal pains  Swelling of the abdomen that is new, acute  Fever of 100F or higher  For urgent or emergent issues, a gastroenterologist can be reached at any hour by calling [phone]. Do not use MyChart messaging for urgent concerns.    DIET:  We do recommend a small meal at first, but then you may proceed to your regular diet.  Drink plenty of fluids but you should avoid alcoholic beverages for 24 hours.  ACTIVITY:  You should plan to take it easy for the rest of today and you should NOT DRIVE or  use heavy machinery until tomorrow (because of the sedation medicines used during the test).    FOLLOW UP: Our staff will call the number listed on your records the next business day following your procedure.  We will call around 7:15- 8:00 am to check on you and address any questions or concerns that you may have regarding the information given to you following your procedure. If we do not reach you, we will leave a message.  If you develop any symptoms (ie: fever, flu-like symptoms, shortness of breath, cough etc.) before then, please call [phone].  If you test positive for Covid 19 in the 2 weeks post procedure, please call and report this information to [place].    If any biopsies were taken you will be contacted by phone or by letter within the next 1-3 weeks.  Please call us at [phone] if you have not heard about the biopsies in 3 weeks.    SIGNATURES/CONFIDENTIALITY: You and/or your care partner have signed paperwork which will be entered into your electronic medical record.  These signatures attest to the fact that that the information above on your After Visit Summary has been reviewed and is understood.  Full responsibility of the confidentiality of this discharge information lies with you and/or your care-partner.

## 2022-05-09 NOTE — Op Note (Signed)
[facility] Patient Name: [name] Procedure Date: 05/09/2022 8:23 AM MRN: [id number] Endoscopist: Ladene Artist , MD Age: 51 Referring MD:  Date of Birth: July 31, 1971 Gender: Female Account #: [id number] Procedure:                Colonoscopy Indications:              Surveillance: Personal history of adenomatous                            polyps on last colonoscopy > 3 years ago Medicines:                Monitored Anesthesia Care Procedure:                Pre-Anesthesia Assessment:                           - Prior to the procedure, a History and Physical                            was performed, and patient medications and                            allergies were reviewed. The patient's tolerance of                            previous anesthesia was also reviewed. The risks                            and benefits of the procedure and the sedation                            options and risks were discussed with the patient.                            All questions were answered, and informed consent                            was obtained. Prior Anticoagulants: The patient has                            taken no previous anticoagulant or antiplatelet                            agents. ASA Grade Assessment: II - A patient with                            mild systemic disease. After reviewing the risks                            and benefits, the patient was deemed in                            satisfactory condition to undergo the procedure.  After obtaining informed consent, the colonoscope                            was passed under direct vision. Throughout the                            procedure, the patient's blood pressure, pulse, and                            oxygen saturations were monitored continuously. The                            PCF-HQ190L Colonoscope was introduced through the                            anus and advanced to  the the cecum, identified by                            appendiceal orifice and ileocecal valve. The                            ileocecal valve, appendiceal orifice, and rectum                            were photographed. The quality of the bowel                            preparation was excellent. The colonoscopy was                            performed without difficulty. The patient tolerated                            the procedure well. Scope In: 8:27:01 AM Scope Out: 8:44:59 AM Scope Withdrawal Time: 0 hours 14 minutes 51 seconds  Total Procedure Duration: 0 hours 17 minutes 58 seconds  Findings:                 The perianal and digital rectal examinations were                            normal.                           Three sessile polyps were found in the sigmoid                            colon (1) and transverse colon (2). The polyps were                            5 to 6 mm in size. These polyps were removed with a                            cold snare. Resection and retrieval were complete.  A medium post polypectomy scar was found in the                            transverse colon. There was no evidence of the                            previous polyp.                           A small post hemorrhoid banding scars were found in                            the distal rectum.                           Small external and internal hemorrhoids were found                            during retroflexion. The hemorrhoids were small and                            Grade I (internal hemorrhoids that do not prolapse).                           The exam was otherwise without abnormality on                            direct and retroflexion views. Complications:            No immediate complications. Estimated blood loss:                            None. Estimated Blood Loss:     Estimated blood loss: none. Impression:               - Three 5 to 6 mm polyps in  the sigmoid colon and                            in the transverse colon, removed with a cold snare.                            Resected and retrieved.                           - Post polypectomy scar in the transverse colon.                           - Post hemorrhoid banding scars in the distal                            rectum.                           - Small external and internal hemorrhoids.                           -  The examination was otherwise normal on direct                            and retroflexion views. Recommendation:           - Repeat colonoscopy after studies are complete for                            surveillance based on pathology results.                           - Patient has a contact number available for                            emergencies. The signs and symptoms of potential                            delayed complications were discussed with the                            patient. Return to normal activities tomorrow.                            Written discharge instructions were provided to the                            patient.                           - Resume previous diet.                           - Continue present medications.                           - Await pathology results. Ladene Artist, MD 05/09/2022 8:59:02 AM This report has been signed electronically.

## 2022-05-09 NOTE — Progress Notes (Signed)
History & Physical  Primary Care Physician:  Eulas Post, MD Primary Gastroenterologist: Lucio Edward, MD  CHIEF COMPLAINT:  Personal history of colon polyps   HPI: [name] is a 51 y.o. female with a personal history of adenomatous colon polyps for surveillance colonoscopy.   Past Medical History:  Diagnosis Date   ABDOMINAL PAIN 05/05/2008   ABDOMINAL PAIN OTHER SPECIFIED SITE 03/23/2009   ALLERGIC  RHINITIS 08/21/2006   Allergy    Anxiety    CERVICAL LYMPHADENOPATHY 06/08/2009   DEPRESSION 08/21/2006   Diarrhea 03/23/2009   HEMATOCHEZIA 05/10/2010   HEMORRHOIDS 03/23/2009   HEMORRHOIDS-EXTERNAL 03/23/2009   Hyperlipidemia    INSOMNIA, TRANSIENT 07/26/2010    Past Surgical History:  Procedure Laterality Date   LEEP     LEEP      Prior to Admission medications   Medication Sig Start Date End Date Taking? Authorizing Provider  cetirizine (ZYRTEC) 10 MG tablet Take 10 mg by mouth daily.   Yes [provider]  sertraline (ZOLOFT) 100 MG tablet TAKE TWO TABLETS BY MOUTH DAILY 10/09/21  Yes Burchette, Alinda Sierras, MD  levonorgestrel (MIRENA) 20 MCG/24HR IUD Mirena 20 mcg/24 hours (5 yrs) 52 mg intrauterine device  Take 1 device by intrauterine route.    [provider]  temazepam (RESTORIL) 15 MG capsule Take one capsule by mouth qhs prn insomnia 10/09/21   Burchette, Alinda Sierras, MD  valACYclovir (VALTREX) 1000 MG tablet Take two at onset of cold sore and repeat two in 12 hours. 10/09/21   Burchette, Alinda Sierras, MD    Current Outpatient Medications  Medication Sig Dispense Refill   cetirizine (ZYRTEC) 10 MG tablet Take 10 mg by mouth daily.     sertraline (ZOLOFT) 100 MG tablet TAKE TWO TABLETS BY MOUTH DAILY 180 tablet 3   levonorgestrel (MIRENA) 20 MCG/24HR IUD Mirena 20 mcg/24 hours (5 yrs) 52 mg intrauterine device  Take 1 device by intrauterine route.     temazepam (RESTORIL) 15 MG capsule Take one capsule by mouth qhs prn insomnia 30 capsule 3    valACYclovir (VALTREX) 1000 MG tablet Take two at onset of cold sore and repeat two in 12 hours. 60 tablet 1   Current Facility-Administered Medications  Medication Dose Route Frequency Provider Last Rate Last Admin   0.9 %  sodium chloride infusion  500 mL Intravenous Once Lucio Edward T, MD       0.9 %  sodium chloride infusion  500 mL Intravenous Once Ladene Artist, MD        Allergies as of 05/09/2022 - Review Complete 05/09/2022  Allergen Reaction Noted   Codeine Nausea And Vomiting 05/05/2008    Family History  Problem Relation Age of Onset   Depression Sister    Depression Brother    Cancer Other        brain, grandmother   Heart disease Other    Colon cancer Neg Hx    Esophageal cancer Neg Hx    Stomach cancer Neg Hx    Rectal cancer Neg Hx     Social History   Socioeconomic History   Marital status: Married    Spouse name: Not on file   Number of children: 1   Years of education: Not on file   Highest education level: Not on file  Occupational History   Not on file  Tobacco Use   Smoking status: Never   Smokeless tobacco: Never  Vaping Use   Vaping Use: Never used  Substance and Sexual Activity   Alcohol use: Yes    Comment: Occasional   Drug use: No   Sexual activity: Yes  Other Topics Concern   Not on file  Social History Narrative   Not on file   Social Determinants of Health   Financial Resource Strain: Not on file  Food Insecurity: Not on file  Transportation Needs: Not on file  Physical Activity: Not on file  Stress: Not on file  Social Connections: Not on file  Intimate Partner Violence: Not on file    Review of Systems:  All systems reviewed were negative except where noted in HPI.   Physical Exam: General:  Alert, well-developed, in NAD Head:  Normocephalic and atraumatic. Eyes:  Sclera clear, no icterus.   Conjunctiva pink. Ears:  Normal auditory acuity. Mouth:  No deformity or lesions.  Neck:  Supple; no masses  . Lungs:  Clear throughout to auscultation.   No wheezes, crackles, or rhonchi. No acute distress. Heart:  Regular rate and rhythm; no murmurs. Abdomen:  Soft, nondistended, nontender. No masses, hepatomegaly. No obvious masses.  Normal bowel .    Rectal:  Deferred   Msk:  Symmetrical without gross deformities.. Pulses:  Normal pulses noted. Extremities:  Without edema. Neurologic:  Alert and  oriented x4;  grossly normal neurologically. Skin:  Intact without significant lesions or rashes. Cervical Nodes:  No significant cervical adenopathy. Psych:  Alert and cooperative. Normal mood and affect.   Impression / Plan:   Personal history of adenomatous colon polyps for surveillance colonoscopy.  Pricilla Riffle. Fuller Plan  05/09/2022, 8:19 AM See Shea Evans, [facility], to contact our on call provider

## 2022-05-09 NOTE — Progress Notes (Signed)
Called to room to assist during endoscopic procedure.  Patient ID and intended procedure confirmed with present staff. Received instructions for my participation in the procedure from the performing physician.  

## 2022-05-09 NOTE — Progress Notes (Signed)
Pt's states no medical or surgical changes since previsit or office visit. 

## 2022-05-10 ENCOUNTER — Telehealth: Payer: Self-pay

## 2022-05-10 NOTE — Telephone Encounter (Signed)
  Follow up Call-     05/09/2022    7:45 AM  Call back number  Post procedure Call Back phone  # [phone]  Permission to leave phone message Yes     Patient questions:  Do you have a fever, pain , or abdominal swelling? No. Pain Score  0 *  Have you tolerated food without any problems? Yes.    Have you been able to return to your normal activities? Yes.    Do you have any questions about your discharge instructions: Diet   No. Medications  No. Follow up visit  No.  Do you have questions or concerns about your Care? No.  Actions: * If pain score is 4 or above: No action needed, pain <4.

## 2022-05-11 ENCOUNTER — Other Ambulatory Visit: Payer: Self-pay | Admitting: Family Medicine

## 2022-05-13 NOTE — Telephone Encounter (Signed)
Last OV-10/09/21 Last refill- 30 capsules, 3 refills  No future OV scheduled.

## 2022-05-25 ENCOUNTER — Encounter: Payer: Self-pay | Admitting: Gastroenterology

## 2022-11-19 DIAGNOSIS — D485 Neoplasm of uncertain behavior of skin: Secondary | ICD-10-CM | POA: Diagnosis not present

## 2022-11-19 DIAGNOSIS — D225 Melanocytic nevi of trunk: Secondary | ICD-10-CM | POA: Diagnosis not present

## 2022-12-04 ENCOUNTER — Other Ambulatory Visit: Payer: Self-pay | Admitting: Family Medicine

## 2022-12-07 IMAGING — MG MM DIGITAL DIAGNOSTIC UNILAT*L* W/ TOMO W/ CAD
6 of 12 series · 6 of 36 positions shown · non-contrast
Comparison: Previous exam(s).

CLINICAL DATA: 50-year-old female presenting with a new lump in the
left breast.

EXAM:
DIGITAL DIAGNOSTIC UNILATERAL LEFT MAMMOGRAM WITH TOMOSYNTHESIS AND
CAD; ULTRASOUND LEFT BREAST LIMITED
TECHNIQUE: Left digital diagnostic mammography and breast tomosynthesis was
performed. The images were evaluated with computer-aided detection.;
Targeted ultrasound examination of the left breast was performed.

[L CC synth-2D (1 of 2)]
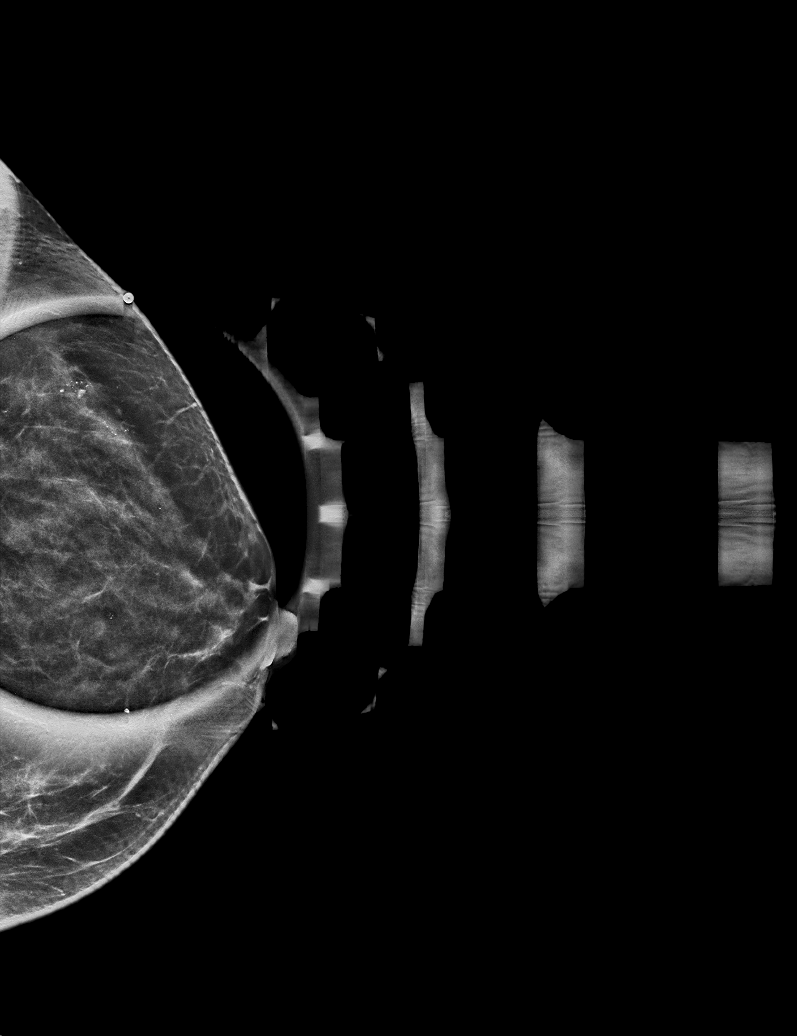

[L ML synth-2D]
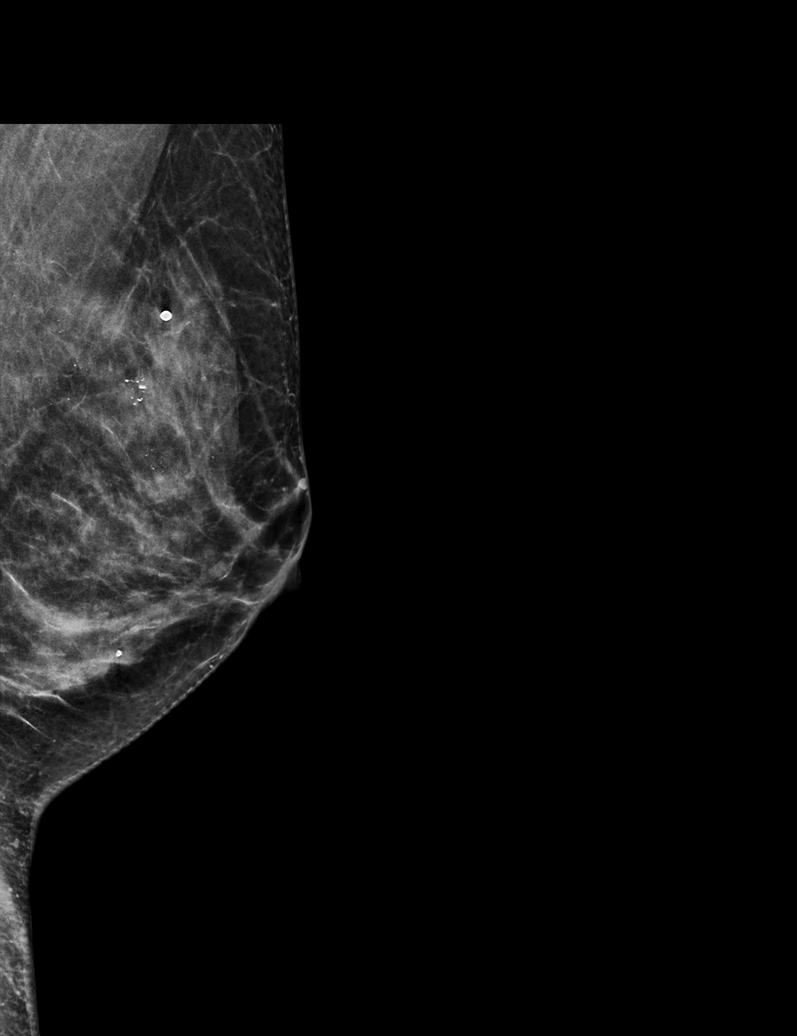

[L CC synth-2D (2 of 2)]
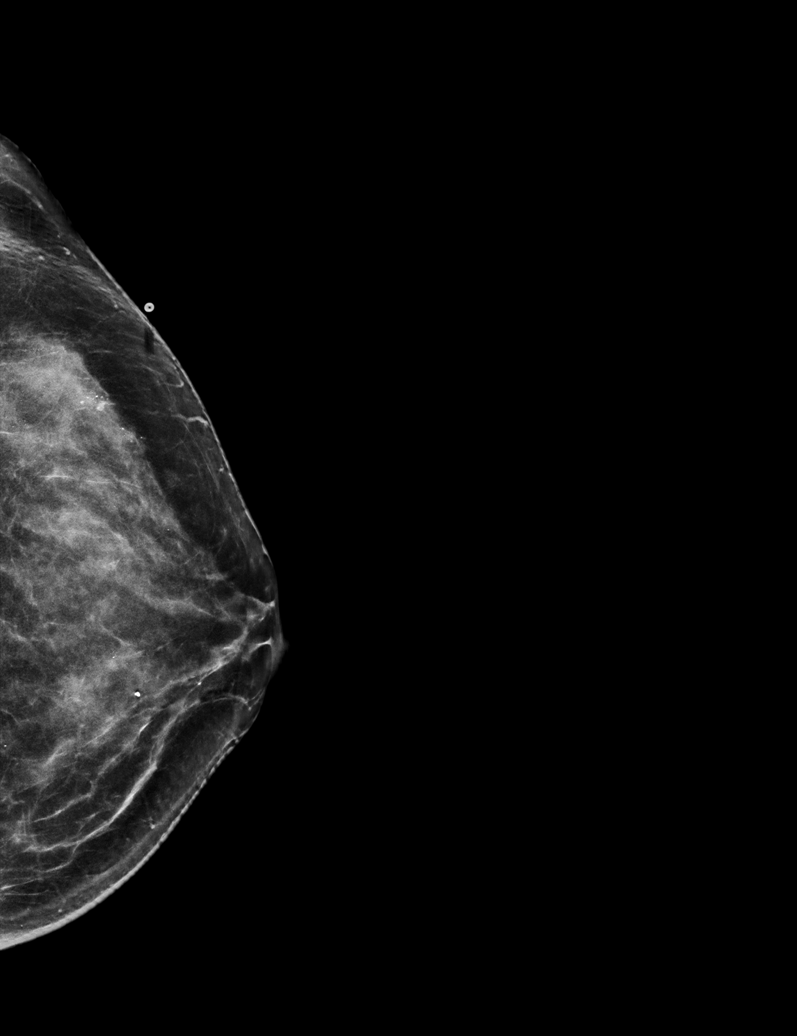

[L MLO synth-2D (1 of 2)]
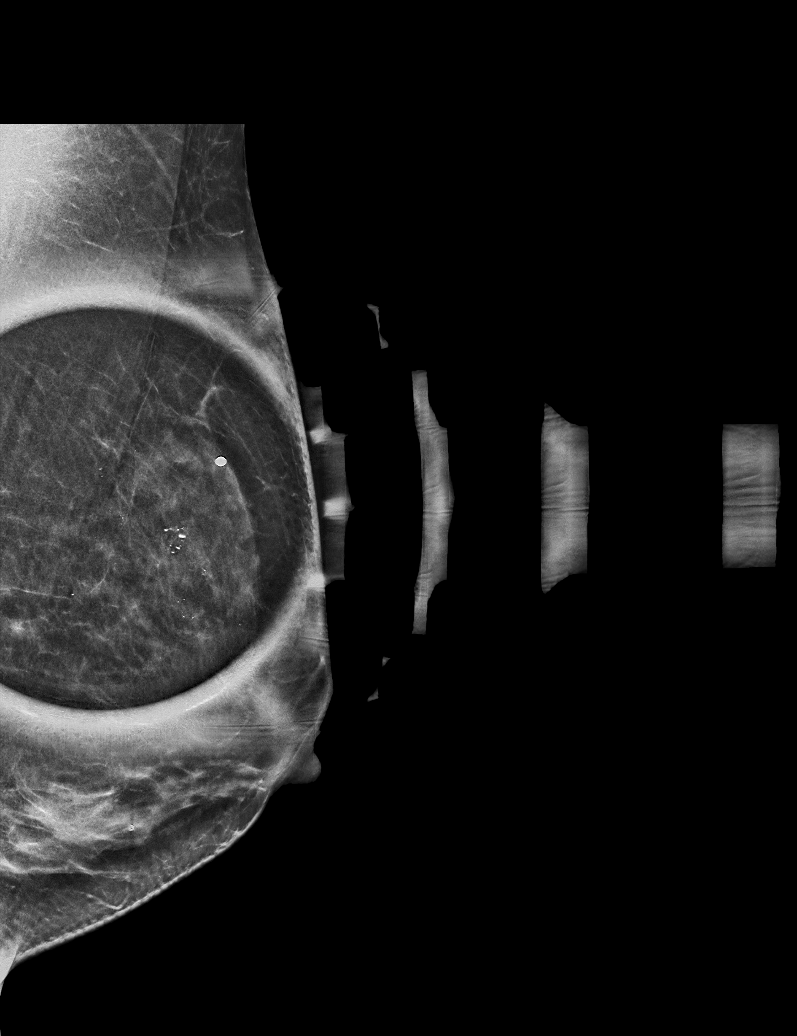

[L MLO synth-2D (2 of 2)]
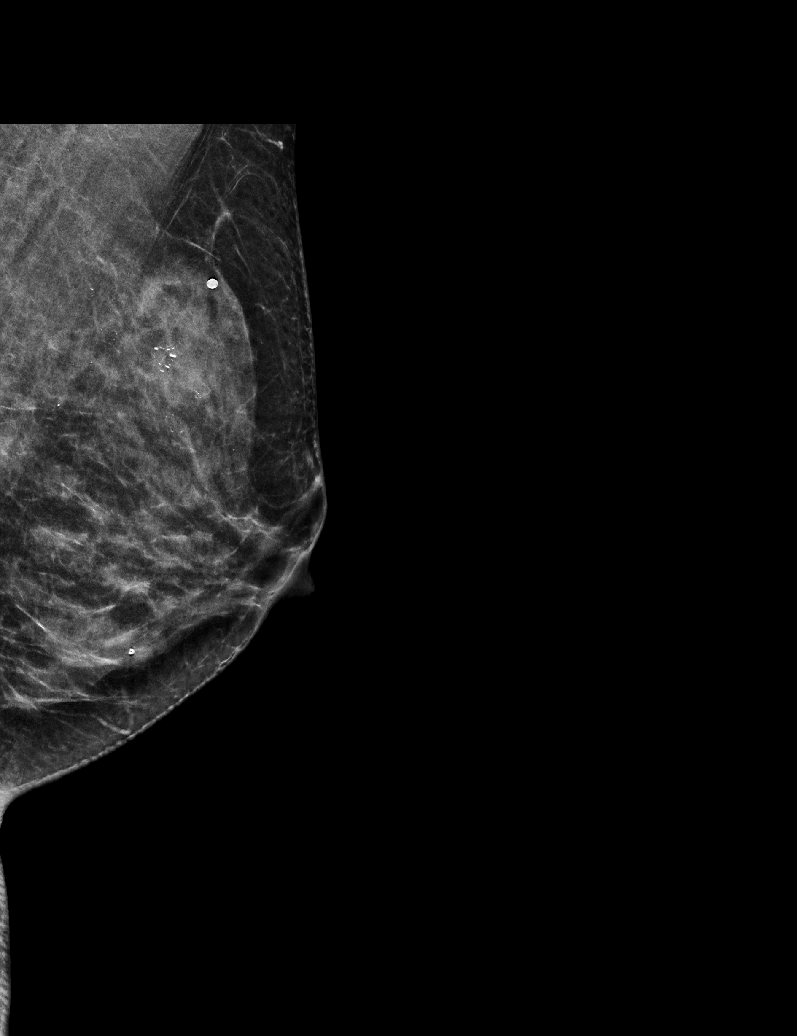

[L TAN synth-2D]
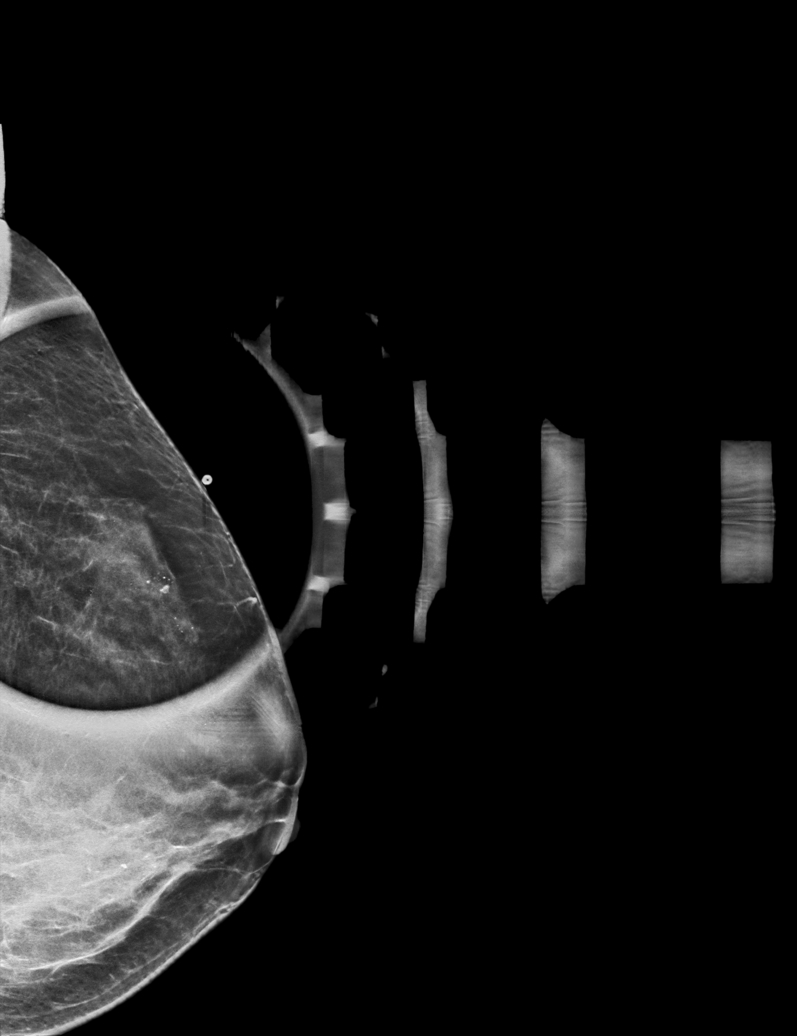

[6 of 36 positions shown; findings below may reference images not displayed]

ACR Breast Density Category c: The breast tissue is heterogeneously
dense, which may obscure small masses.
FINDINGS: Mammogram:

A skin BB marks the site of concern reported by the patient in the
upper outer left breast. A spot tangential view of this area was
performed in addition to standard views. There is no definite
abnormality at the palpable site. Additional spot compression
tomosynthesis views were performed for a questioned mass in the
central inferior left breast. There is a persistent small oval
circumscribed mass measuring approximately 0.5 cm.

On physical exam at the site of concern reported by the patient in
the left breast I feel a small discrete mass.

Ultrasound:

Targeted ultrasound performed at the palpable site of concern
reported by the patient at 3 o'clock 4 cm from the nipple
demonstrating an oval circumscribed anechoic mass with posterior
enhancement consistent with a benign simple cyst measuring 1.0 x
x 1.0 cm. There are additional tiny incidental simple cysts at 3
o'clock. At 5 o'clock 2 cm from the nipple there is another oval
circumscribed anechoic mass consistent with a benign simple cyst
measuring 0.5 x 0.3 x 0.4 cm. This corresponds to the mammographic
finding. No suspicious solid mass.
IMPRESSION: At the palpable site of concern in the left breast there is a benign
simple cyst. Several additional smaller simple cysts are identified
in the outer left breast. No mammographic or sonographic evidence of
malignancy.

RECOMMENDATION:
Return for routine annual screening mammography which will be due in
November 2021.

I have discussed the findings and recommendations with the patient.
If applicable, a reminder letter will be sent to the patient
regarding the next appointment.

BI-RADS CATEGORY  2: Benign.

## 2022-12-26 DIAGNOSIS — Z113 Encounter for screening for infections with a predominantly sexual mode of transmission: Secondary | ICD-10-CM | POA: Diagnosis not present

## 2022-12-26 DIAGNOSIS — Z1151 Encounter for screening for human papillomavirus (HPV): Secondary | ICD-10-CM | POA: Diagnosis not present

## 2022-12-26 DIAGNOSIS — Z1231 Encounter for screening mammogram for malignant neoplasm of breast: Secondary | ICD-10-CM | POA: Diagnosis not present

## 2022-12-26 DIAGNOSIS — Z130 Encounter for screening for diseases of the blood and blood-forming organs and certain disorders involving the immune mechanism: Secondary | ICD-10-CM | POA: Diagnosis not present

## 2022-12-26 DIAGNOSIS — Z01419 Encounter for gynecological examination (general) (routine) without abnormal findings: Secondary | ICD-10-CM | POA: Diagnosis not present

## 2022-12-26 DIAGNOSIS — Z124 Encounter for screening for malignant neoplasm of cervix: Secondary | ICD-10-CM | POA: Diagnosis not present

## 2022-12-31 ENCOUNTER — Other Ambulatory Visit: Payer: Self-pay | Admitting: Obstetrics and Gynecology

## 2022-12-31 DIAGNOSIS — R928 Other abnormal and inconclusive findings on diagnostic imaging of breast: Secondary | ICD-10-CM

## 2023-01-01 LAB — HM PAP SMEAR: HPV, high-risk: NEGATIVE

## 2023-01-10 ENCOUNTER — Ambulatory Visit
Admission: RE | Admit: 2023-01-10 | Discharge: 2023-01-10 | Disposition: A | Discharge status: Home / Self Care | Payer: BC Managed Care – PPO | Source: Ambulatory Visit | Attending: Obstetrics and Gynecology | Admitting: Obstetrics and Gynecology

## 2023-01-10 DIAGNOSIS — R928 Other abnormal and inconclusive findings on diagnostic imaging of breast: Secondary | ICD-10-CM

## 2023-01-10 DIAGNOSIS — R921 Mammographic calcification found on diagnostic imaging of breast: Secondary | ICD-10-CM | POA: Diagnosis not present

## 2023-01-10 LAB — HM MAMMOGRAPHY

## 2023-01-13 ENCOUNTER — Other Ambulatory Visit: Payer: Self-pay | Admitting: Family Medicine

## 2023-01-23 ENCOUNTER — Telehealth: Payer: Self-pay | Admitting: Family Medicine

## 2023-01-23 MED ORDER — VALACYCLOVIR HCL 1 G PO TABS: ORAL_TABLET | ORAL | 0 refills | Status: DC

## 2023-01-23 NOTE — Telephone Encounter (Signed)
Prescription Request  01/23/2023  LOV: Visit date not found pt has cpe sch with md on 03-19-2003  What is the name of the medication or equipment? valACYclovir (VALTREX) 1000 MG tablet   Have you contacted your pharmacy to request a refill? No   Which pharmacy would you like this sent to?  [facility] [id number] - [place], [place] - [address] [place] [address] Phone: [phone] Fax: [phone]    Patient notified that their request is being sent to the clinical staff for review and that they should receive a response within 2 business days.   Please advise at Mobile [phone] (mobile)

## 2023-01-23 NOTE — Telephone Encounter (Signed)
Rx sent 

## 2023-03-05 ENCOUNTER — Encounter: Payer: BC Managed Care – PPO | Admitting: Family Medicine

## 2023-03-19 ENCOUNTER — Ambulatory Visit (INDEPENDENT_AMBULATORY_CARE_PROVIDER_SITE_OTHER): Payer: BC Managed Care – PPO | Admitting: Family Medicine

## 2023-03-19 ENCOUNTER — Other Ambulatory Visit (HOSPITAL_COMMUNITY)
Admission: RE | Admit: 2023-03-19 | Discharge: 2023-03-19 | Disposition: A | Discharge status: Home / Self Care | Payer: BC Managed Care – PPO | Source: Ambulatory Visit | Attending: Family Medicine | Admitting: Family Medicine

## 2023-03-19 VITALS — BP 122/80 | HR 82 | Temp 97.8°F | Ht 65.75 in | Wt 141.8 lb

## 2023-03-19 DIAGNOSIS — Z113 Encounter for screening for infections with a predominantly sexual mode of transmission: Secondary | ICD-10-CM

## 2023-03-19 DIAGNOSIS — Z0000 Encounter for general adult medical examination without abnormal findings: Secondary | ICD-10-CM | POA: Diagnosis not present

## 2023-03-19 DIAGNOSIS — R5383 Other fatigue: Secondary | ICD-10-CM | POA: Diagnosis not present

## 2023-03-19 DIAGNOSIS — Z5181 Encounter for therapeutic drug level monitoring: Secondary | ICD-10-CM | POA: Diagnosis not present

## 2023-03-19 DIAGNOSIS — Z79890 Hormone replacement therapy (postmenopausal): Secondary | ICD-10-CM

## 2023-03-19 LAB — CBC WITH DIFFERENTIAL/PLATELET
Basophils Absolute: 0 10*3/uL (ref 0.0–0.1)
Basophils Relative: 0.4 % (ref 0.0–3.0)
Eosinophils Absolute: 0 10*3/uL (ref 0.0–0.7)
Eosinophils Relative: 0.4 % (ref 0.0–5.0)
HCT: 39.6 % (ref 36.0–46.0)
Hemoglobin: 13 g/dL (ref 12.0–15.0)
Lymphocytes Relative: 27.3 % (ref 12.0–46.0)
Lymphs Abs: 1.3 10*3/uL (ref 0.7–4.0)
MCHC: 33 g/dL (ref 30.0–36.0)
MCV: 88.7 fl (ref 78.0–100.0)
Monocytes Absolute: 0.3 10*3/uL (ref 0.1–1.0)
Monocytes Relative: 5.8 % (ref 3.0–12.0)
Neutro Abs: 3.2 10*3/uL (ref 1.4–7.7)
Neutrophils Relative %: 66.1 % (ref 43.0–77.0)
Platelets: 215 10*3/uL (ref 150.0–400.0)
RBC: 4.46 Mil/uL (ref 3.87–5.11)
RDW: 15.2 % (ref 11.5–15.5)
WBC: 4.9 10*3/uL (ref 4.0–10.5)

## 2023-03-19 LAB — LIPID PANEL
Cholesterol: 286 mg/dL — ABNORMAL HIGH (ref 0–200)
HDL: 66.3 mg/dL (ref >39.00)
LDL Cholesterol: 202 mg/dL — ABNORMAL HIGH (ref 0–99)
NonHDL: 219.42
Total CHOL/HDL Ratio: 4
Triglycerides: 89 mg/dL (ref 0.0–149.0)
VLDL: 17.8 mg/dL (ref 0.0–40.0)

## 2023-03-19 LAB — HEPATIC FUNCTION PANEL
ALT: 10 U/L (ref 0–35)
AST: 16 U/L (ref 0–37)
Albumin: 4.6 g/dL (ref 3.5–5.2)
Alkaline Phosphatase: 75 U/L (ref 39–117)
Bilirubin, Direct: 0.1 mg/dL (ref 0.0–0.3)
Total Bilirubin: 0.4 mg/dL (ref 0.2–1.2)
Total Protein: 7.8 g/dL (ref 6.0–8.3)

## 2023-03-19 LAB — BASIC METABOLIC PANEL
BUN: 13 mg/dL (ref 6–23)
CO2: 26 mEq/L (ref 19–32)
Calcium: 9.7 mg/dL (ref 8.4–10.5)
Chloride: 105 mEq/L (ref 96–112)
Creatinine, Ser: 0.89 mg/dL (ref 0.40–1.20)
GFR: 74.72 mL/min (ref >60.00)
Glucose, Bld: 83 mg/dL (ref 70–99)
Potassium: 3.8 mEq/L (ref 3.5–5.1)
Sodium: 140 mEq/L (ref 135–145)

## 2023-03-19 LAB — TSH: TSH: 1.48 u[IU]/mL (ref 0.35–5.50)

## 2023-03-19 LAB — TESTOSTERONE: Testosterone: 21.47 ng/dL (ref 15.00–40.00)

## 2023-03-19 MED ORDER — ZOLPIDEM TARTRATE 10 MG PO TABS: 10.0000 mg | ORAL_TABLET | Freq: Every evening | ORAL | 1 refills | Status: AC | PRN

## 2023-03-19 MED ORDER — VALACYCLOVIR HCL 1 G PO TABS: ORAL_TABLET | ORAL | 0 refills | Status: DC

## 2023-03-19 NOTE — Progress Notes (Signed)
Established Patient Office Visit  Subjective   Patient ID: [name], female    DOB: [birth year]  Age: 52 y.o. MRN: [id number]  Chief Complaint  Patient presents with   Annual Exam    HPI   [name] is here for physical exam and medical follow-up.  She has chronic problems including history of allergic rhinitis, recurrent cold sores, chronic anxiety, chronic insomnia, recurrent depression.  She is followed by GYN and gets her Pap smears and mammograms through them.  She is also on topical testosterone replacement and requesting follow-up testosterone level.  She went on this about 2 months ago.  She has chronic insomnia currently treated with temazepam.  She is having some hangover drowsiness the next day.  Previously took Ambien and felt like that did not cause any hangover symptoms.  She would like to transition back to Ambien.  She is struggled tremendously with sleep without assistance with medication.  She scaled back her sertraline to 100 mg daily and depression is currently stable with that dosage.  She has history of colon polyps.  She had colonoscopy August of last year which showed some hyperplastic polyps with recommended 5-year follow-up.  Health maintenance reviewed  -Hepatitis C screening negative -Next colonoscopy due 2028 -Tetanus due 2027 -Mammogram and Pap smear up-to-date -Declines shingles vaccine.  Family history-she had paternal grandfather had MI in his 55s.  Her father had history of prostate cancer.  Otherwise parents are fairly healthy.  Social history-she is divorced.  Has a 58 year old son.  Works as a Curator.  Non-smoker.  She does have history of hyperlipidemia with cholesterol over 300 but low 10-year calculated risk for CAD.  The 10-year ASCVD risk score (Arnett DK, et al., 2019) is: 1.6%   Values used to calculate the score:     Age: 57 years     Sex: Female     Is Non-Hispanic African American:  No     Diabetic: No     Tobacco smoker: No     Systolic Blood Pressure: 122 mmHg     Is BP treated: No     HDL Cholesterol: 78 mg/dL     Total Cholesterol: 312 mg/dL   Past Medical History:  Diagnosis Date   ABDOMINAL PAIN 05/05/2008   ABDOMINAL PAIN OTHER SPECIFIED SITE 03/23/2009   ALLERGIC  RHINITIS 08/21/2006   Allergy    Anxiety    CERVICAL LYMPHADENOPATHY 06/08/2009   DEPRESSION 08/21/2006   Diarrhea 03/23/2009   HEMATOCHEZIA 05/10/2010   HEMORRHOIDS 03/23/2009   HEMORRHOIDS-EXTERNAL 03/23/2009   Hyperlipidemia    INSOMNIA, TRANSIENT 07/26/2010   Past Surgical History:  Procedure Laterality Date   LEEP     LEEP      reports that she has never smoked. She has never used smokeless tobacco. She reports current alcohol use. She reports that she does not use drugs. family history includes Cancer in an other family member; Depression in her brother and sister; Heart disease in an other family member. Allergies  Allergen Reactions   Codeine Nausea And Vomiting    Review of Systems  Constitutional:  Positive for malaise/fatigue. Negative for chills, fever and weight loss.  HENT:  Negative for hearing loss.   Eyes:  Negative for blurred vision and double vision.  Respiratory:  Negative for cough and shortness of breath.   Cardiovascular:  Negative for chest pain, palpitations and leg swelling.  Gastrointestinal:  Negative for abdominal pain, blood in stool, constipation and  diarrhea.  Genitourinary:  Negative for dysuria.  Skin:  Negative for rash.  Neurological:  Negative for dizziness, speech change, seizures, loss of consciousness and headaches.  Psychiatric/Behavioral:  The patient has insomnia.       Objective:     BP 122/80 (BP Location: Left Arm, Cuff Size: Normal)   Pulse 82   Temp 97.8 F (36.6 C) (Oral)   Ht 5' 5.75" (1.67 m)   Wt 141 lb 12.8 oz (64.3 kg)   SpO2 97%   BMI 23.06 kg/m  BP Readings from Last 3 Encounters:  03/19/23 122/80  05/09/22  110/67  11/29/21 122/90   Wt Readings from Last 3 Encounters:  03/19/23 141 lb 12.8 oz (64.3 kg)  05/09/22 140 lb (63.5 kg)  03/01/22 140 lb (63.5 kg)      Physical Exam Vitals reviewed.  Constitutional:      General: She is not in acute distress. HENT:     Right Ear: Tympanic membrane normal.     Left Ear: Tympanic membrane normal.  Cardiovascular:     Rate and Rhythm: Normal rate and regular rhythm.     Heart sounds: No murmur heard. Pulmonary:     Effort: Pulmonary effort is normal.     Breath sounds: Normal breath sounds. No wheezing or rales.  Musculoskeletal:     Cervical back: Neck supple.     Right lower leg: No edema.     Left lower leg: No edema.  Lymphadenopathy:     Cervical: No cervical adenopathy.  Neurological:     Mental Status: She is alert.      No results found for any visits on 03/19/23.    The 10-year ASCVD risk score (Arnett DK, et al., 2019) is: 1.6%    Assessment & Plan:   Problem List Items Addressed This Visit   None Visit Diagnoses     Physical exam    -  Primary   Relevant Orders   Basic metabolic panel   Lipid panel   CBC with Differential/Platelet   Hepatic function panel   Encounter for monitoring testosterone replacement therapy       Relevant Orders   Testosterone   Estrogens, Total   Fatigue, unspecified type       Relevant Orders   TSH   Screen for STD (sexually transmitted disease)       Relevant Orders   HIV antibody (with reflex)   RPR   Urine cytology ancillary only     Discussed the following health maintenance items  -Consider shingles vaccine at some point this year -Repeat colonoscopy 2028 -Tetanus booster 2027 -She plans to continue GYN follow-up for Pap smears and mammograms  -Patient requesting switch from temazepam to Ambien for her chronic insomnia.  She is aware of potential side effects.  Will try Ambien 10 mg nightly which she has taking in the past.  -Refilled Valtrex for as needed  use  -Patient went on testosterone placement couple months ago.  Requesting testosterone level.  Will also check CBC.  She is requesting TSH with her recent fatigue issues.  -She also request STD screening.  Currently asymptomatic.  Will check HIV, RPR, urine GC and chlamydia  No follow-ups on file.    Kristina Peat, MD

## 2023-03-20 ENCOUNTER — Encounter: Payer: Self-pay | Admitting: Family Medicine

## 2023-03-20 LAB — URINE CYTOLOGY ANCILLARY ONLY
Chlamydia: NEGATIVE
Comment: NEGATIVE
Comment: NORMAL
Neisseria Gonorrhea: NEGATIVE

## 2023-03-21 ENCOUNTER — Encounter: Payer: Self-pay | Admitting: Family Medicine

## 2023-03-24 ENCOUNTER — Encounter: Payer: Self-pay | Admitting: Family Medicine

## 2023-03-26 LAB — ESTROGENS, TOTAL: Estrogen: 87 pg/mL

## 2023-03-26 LAB — RPR: RPR Ser Ql: NONREACTIVE

## 2023-03-26 LAB — HIV ANTIBODY (ROUTINE TESTING W REFLEX): HIV 1&2 Ab, 4th Generation: NONREACTIVE

## 2023-04-14 ENCOUNTER — Telehealth: Payer: Self-pay | Admitting: Family Medicine

## 2023-04-14 NOTE — Telephone Encounter (Signed)
Pt is asking if MD thinks it would be ok for a prescription change?  Pt would like to start taking 25 mg of the sertraline (ZOLOFT)  Instead of the sertraline (ZOLOFT) 100 MG tablet ?  Please advise.

## 2023-04-15 MED ORDER — SERTRALINE HCL 25 MG PO TABS: 25.0000 mg | ORAL_TABLET | Freq: Every day | ORAL | 3 refills | Status: DC

## 2023-04-15 NOTE — Addendum Note (Signed)
Addended by: Christy Sartorius on: 04/15/2023 01:19 PM   Modules accepted: Orders

## 2023-04-15 NOTE — Telephone Encounter (Signed)
Patient informed of the message below and rx sent 

## 2023-04-15 NOTE — Addendum Note (Signed)
Addended by: Christy Sartorius on: 04/15/2023 01:21 PM   Modules accepted: Orders

## 2023-04-15 NOTE — Telephone Encounter (Signed)
Left a message for the patient to return my call.  

## 2023-06-19 ENCOUNTER — Telehealth: Payer: Self-pay | Admitting: Family Medicine

## 2023-06-19 DIAGNOSIS — E785 Hyperlipidemia, unspecified: Secondary | ICD-10-CM

## 2023-06-19 NOTE — Telephone Encounter (Signed)
Pt is calling and would like start on stain chole med  [facility] [id number] - [place], [place] - [address] Phone: [phone]  Fax: [phone]

## 2023-06-20 MED ORDER — ROSUVASTATIN CALCIUM 10 MG PO TABS: 10.0000 mg | ORAL_TABLET | Freq: Every day | ORAL | 0 refills | Status: DC

## 2023-06-20 NOTE — Telephone Encounter (Signed)
Rx sent and left detailed message on vm informing patient of message below.

## 2023-08-19 ENCOUNTER — Other Ambulatory Visit: Payer: BC Managed Care – PPO

## 2023-08-19 DIAGNOSIS — E785 Hyperlipidemia, unspecified: Secondary | ICD-10-CM

## 2023-08-19 LAB — HEPATIC FUNCTION PANEL
ALT: 13 U/L (ref 0–35)
AST: 17 U/L (ref 0–37)
Albumin: 4.6 g/dL (ref 3.5–5.2)
Alkaline Phosphatase: 72 U/L (ref 39–117)
Bilirubin, Direct: 0.1 mg/dL (ref 0.0–0.3)
Total Bilirubin: 0.5 mg/dL (ref 0.2–1.2)
Total Protein: 7.3 g/dL (ref 6.0–8.3)

## 2023-08-19 LAB — LIPID PANEL
Cholesterol: 150 mg/dL (ref 0–200)
HDL: 76.2 mg/dL (ref >39.00)
LDL Cholesterol: 62 mg/dL (ref 0–99)
NonHDL: 74.21
Total CHOL/HDL Ratio: 2
Triglycerides: 60 mg/dL (ref 0.0–149.0)
VLDL: 12 mg/dL (ref 0.0–40.0)

## 2023-09-16 ENCOUNTER — Other Ambulatory Visit: Payer: Self-pay | Admitting: Family Medicine

## 2023-10-20 ENCOUNTER — Other Ambulatory Visit: Payer: Self-pay | Admitting: Family Medicine

## 2023-10-20 NOTE — Telephone Encounter (Signed)
 Copied from CRM [id number]. Topic: Clinical - Medication Refill >> Oct 20, 2023 10:09 AM Robinson DEL wrote: Most Recent Primary Care Visit:  Provider: [facility]  Department: [facility]  Visit Type: LAB  Date: 08/19/2023  Medication: ***  Has the patient contacted their pharmacy?  (Agent: If no, request that the patient contact the pharmacy for the refill. If patient does not wish to contact the pharmacy document the reason why and proceed with request.) (Agent: If yes, when and what did the pharmacy advise?)  Is this the correct pharmacy for this prescription?  If no, delete pharmacy and type the correct one.  This is the patient's preferred pharmacy:  [facility] [id number] - [place], [place] - [address] [place] [address] Phone: [phone] Fax: [phone]   Has the prescription been filled recently?   Is the patient out of the medication?   Has the patient been seen for an appointment in the last year OR does the patient have an upcoming appointment?   Can we respond through MyChart?   Agent: Please be advised that Rx refills may take up to 3 business days. We ask that you follow-up with your pharmacy.

## 2023-10-20 NOTE — Telephone Encounter (Signed)
 Copied from CRM [id number]. Topic: Clinical - Medication Refill >> Oct 20, 2023 10:09 AM Robinson DEL wrote: Most Recent Primary Care Visit:  Provider: [facility]  Department: [facility]  Visit Type: LAB  Date: 08/19/2023  Medication: valACYclovir  (VALTREX ) 1000 MG tablet  Has the patient contacted their pharmacy? No, no refills on bottle (Agent: If no, request that the patient contact the pharmacy for the refill. If patient does not wish to contact the pharmacy document the reason why and proceed with request.) (Agent: If yes, when and what did the pharmacy advise?)  Is this the correct pharmacy for this prescription? Yes If no, delete pharmacy and type the correct one.  This is the patient's preferred pharmacy:  [facility] [id number] - [place], [place] - [address] [place] [address] Phone: [phone] Fax: [phone]   Has the prescription been filled recently? No  Is the patient out of the medication? No  Has the patient been seen for an appointment in the last year OR does the patient have an upcoming appointment? Yes  Can we respond through MyChart? Yes  Agent: Please be advised that Rx refills may take up to 3 business days. We ask that you follow-up with your pharmacy.

## 2023-10-22 MED ORDER — VALACYCLOVIR HCL 1 G PO TABS: ORAL_TABLET | ORAL | 0 refills | Status: AC

## 2023-10-30 DIAGNOSIS — F411 Generalized anxiety disorder: Secondary | ICD-10-CM | POA: Diagnosis not present

## 2023-11-13 DIAGNOSIS — Z1329 Encounter for screening for other suspected endocrine disorder: Secondary | ICD-10-CM | POA: Diagnosis not present

## 2023-11-13 DIAGNOSIS — N951 Menopausal and female climacteric states: Secondary | ICD-10-CM | POA: Diagnosis not present

## 2023-11-20 DIAGNOSIS — N951 Menopausal and female climacteric states: Secondary | ICD-10-CM | POA: Diagnosis not present

## 2023-12-25 DIAGNOSIS — R5383 Other fatigue: Secondary | ICD-10-CM | POA: Diagnosis not present

## 2023-12-25 DIAGNOSIS — N951 Menopausal and female climacteric states: Secondary | ICD-10-CM | POA: Diagnosis not present

## 2024-01-01 DIAGNOSIS — N951 Menopausal and female climacteric states: Secondary | ICD-10-CM | POA: Diagnosis not present

## 2024-01-01 DIAGNOSIS — Z6822 Body mass index (BMI) 22.0-22.9, adult: Secondary | ICD-10-CM | POA: Diagnosis not present

## 2024-01-01 DIAGNOSIS — Z01419 Encounter for gynecological examination (general) (routine) without abnormal findings: Secondary | ICD-10-CM | POA: Diagnosis not present

## 2024-01-01 DIAGNOSIS — Z124 Encounter for screening for malignant neoplasm of cervix: Secondary | ICD-10-CM | POA: Diagnosis not present

## 2024-01-01 DIAGNOSIS — Z1231 Encounter for screening mammogram for malignant neoplasm of breast: Secondary | ICD-10-CM | POA: Diagnosis not present

## 2024-01-01 DIAGNOSIS — M2550 Pain in unspecified joint: Secondary | ICD-10-CM | POA: Diagnosis not present

## 2024-01-01 DIAGNOSIS — R5383 Other fatigue: Secondary | ICD-10-CM | POA: Diagnosis not present

## 2024-01-01 DIAGNOSIS — Z113 Encounter for screening for infections with a predominantly sexual mode of transmission: Secondary | ICD-10-CM | POA: Diagnosis not present

## 2024-01-12 DIAGNOSIS — L814 Other melanin hyperpigmentation: Secondary | ICD-10-CM | POA: Diagnosis not present

## 2024-01-12 DIAGNOSIS — L821 Other seborrheic keratosis: Secondary | ICD-10-CM | POA: Diagnosis not present

## 2024-01-12 DIAGNOSIS — D225 Melanocytic nevi of trunk: Secondary | ICD-10-CM | POA: Diagnosis not present

## 2024-01-12 DIAGNOSIS — L578 Other skin changes due to chronic exposure to nonionizing radiation: Secondary | ICD-10-CM | POA: Diagnosis not present

## 2024-02-26 DIAGNOSIS — N951 Menopausal and female climacteric states: Secondary | ICD-10-CM | POA: Diagnosis not present

## 2024-02-26 DIAGNOSIS — R5383 Other fatigue: Secondary | ICD-10-CM | POA: Diagnosis not present

## 2024-03-04 DIAGNOSIS — N951 Menopausal and female climacteric states: Secondary | ICD-10-CM | POA: Diagnosis not present

## 2024-03-04 DIAGNOSIS — Z6821 Body mass index (BMI) 21.0-21.9, adult: Secondary | ICD-10-CM | POA: Diagnosis not present

## 2024-03-04 DIAGNOSIS — G479 Sleep disorder, unspecified: Secondary | ICD-10-CM | POA: Diagnosis not present

## 2024-04-21 DIAGNOSIS — M7501 Adhesive capsulitis of right shoulder: Secondary | ICD-10-CM | POA: Diagnosis not present

## 2024-06-01 ENCOUNTER — Ambulatory Visit (INDEPENDENT_AMBULATORY_CARE_PROVIDER_SITE_OTHER): Admitting: Family Medicine

## 2024-06-01 ENCOUNTER — Ambulatory Visit: Payer: Self-pay | Admitting: Family Medicine

## 2024-06-01 ENCOUNTER — Encounter: Payer: Self-pay | Admitting: Family Medicine

## 2024-06-01 ENCOUNTER — Other Ambulatory Visit (HOSPITAL_COMMUNITY)
Admission: RE | Admit: 2024-06-01 | Discharge: 2024-06-01 | Disposition: A | Discharge status: Home / Self Care | Source: Ambulatory Visit | Attending: Family Medicine | Admitting: Family Medicine

## 2024-06-01 VITALS — BP 130/70 | HR 80 | Temp 97.8°F | Ht 65.35 in | Wt 133.0 lb

## 2024-06-01 DIAGNOSIS — Z113 Encounter for screening for infections with a predominantly sexual mode of transmission: Secondary | ICD-10-CM | POA: Insufficient documentation

## 2024-06-01 DIAGNOSIS — Z0000 Encounter for general adult medical examination without abnormal findings: Secondary | ICD-10-CM | POA: Diagnosis not present

## 2024-06-01 LAB — CBC WITH DIFFERENTIAL/PLATELET
Basophils Absolute: 0 K/uL (ref 0.0–0.1)
Basophils Relative: 0.4 % (ref 0.0–3.0)
Eosinophils Absolute: 0 K/uL (ref 0.0–0.7)
Eosinophils Relative: 0.5 % (ref 0.0–5.0)
HCT: 40.6 % (ref 36.0–46.0)
Hemoglobin: 13.5 g/dL (ref 12.0–15.0)
Lymphocytes Relative: 25.2 % (ref 12.0–46.0)
Lymphs Abs: 1.3 K/uL (ref 0.7–4.0)
MCHC: 33.3 g/dL (ref 30.0–36.0)
MCV: 95 fl (ref 78.0–100.0)
Monocytes Absolute: 0.3 K/uL (ref 0.1–1.0)
Monocytes Relative: 6.1 % (ref 3.0–12.0)
Neutro Abs: 3.5 K/uL (ref 1.4–7.7)
Neutrophils Relative %: 67.8 % (ref 43.0–77.0)
Platelets: 190 K/uL (ref 150.0–400.0)
RBC: 4.27 Mil/uL (ref 3.87–5.11)
RDW: 15 % (ref 11.5–15.5)
WBC: 5.2 K/uL (ref 4.0–10.5)

## 2024-06-01 LAB — BASIC METABOLIC PANEL WITH GFR
BUN: 14 mg/dL (ref 6–23)
CO2: 28 meq/L (ref 19–32)
Calcium: 9.5 mg/dL (ref 8.4–10.5)
Chloride: 104 meq/L (ref 96–112)
Creatinine, Ser: 1.01 mg/dL (ref 0.40–1.20)
GFR: 63.66 mL/min (ref >60.00)
Glucose, Bld: 90 mg/dL (ref 70–99)
Potassium: 4.6 meq/L (ref 3.5–5.1)
Sodium: 141 meq/L (ref 135–145)

## 2024-06-01 LAB — LIPID PANEL
Cholesterol: 148 mg/dL (ref 0–200)
HDL: 71.8 mg/dL (ref >39.00)
LDL Cholesterol: 63 mg/dL (ref 0–99)
NonHDL: 76.59
Total CHOL/HDL Ratio: 2
Triglycerides: 68 mg/dL (ref 0.0–149.0)
VLDL: 13.6 mg/dL (ref 0.0–40.0)

## 2024-06-01 LAB — HEPATIC FUNCTION PANEL
ALT: 13 U/L (ref 0–35)
AST: 17 U/L (ref 0–37)
Albumin: 4.6 g/dL (ref 3.5–5.2)
Alkaline Phosphatase: 70 U/L (ref 39–117)
Bilirubin, Direct: 0.1 mg/dL (ref 0.0–0.3)
Total Bilirubin: 0.6 mg/dL (ref 0.2–1.2)
Total Protein: 7.5 g/dL (ref 6.0–8.3)

## 2024-06-01 MED ORDER — ROSUVASTATIN CALCIUM 10 MG PO TABS: 10.0000 mg | ORAL_TABLET | Freq: Every day | ORAL | 3 refills | Status: AC

## 2024-06-01 NOTE — Progress Notes (Signed)
 Established Patient Office Visit  Subjective   Patient ID: [name], female    DOB: Oct 22, 1970  Age: 53 y.o. MRN: [id number]  No chief complaint on file.   HPI   [name] is seen today for physical exam.  She is seen regularly by gynecologist and also sees dermatologist every other year for skin cancer screening.  [name] has history of hyperlipidemia and currently takes rosuvastatin  10 mg daily.  She states her brother had MI last year at 18.  He was a smoker.  She also has a twin sister who had very high coronary calcium  score that prompted [name] going on statin therapy.  Generally doing well.  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  Would like to get STD screening today.  Denies any specific symptoms.  Health maintenance reviewed:  Health Maintenance  Topic Date Due   Hepatitis B Vaccines 19-59 Average Risk (1 of 3 - 19+ 3-dose series) Never done   Zoster Vaccines- Shingrix (1 of 2) Never done   COVID-19 Vaccine (5 - 2024-25 season) 06/08/2023   INFLUENZA VACCINE  05/07/2024   Pneumococcal Vaccine: 50+ Years (1 of 1 - PCV) 06/01/2025 (Originally 02/19/2021)   MAMMOGRAM  01/09/2025   DTaP/Tdap/Td (2 - Td or Tdap) 09/24/2026   Colonoscopy  05/10/2027   Cervical Cancer Screening (HPV/Pap Cotest)  01/01/2028   Hepatitis C Screening  Completed   HIV Screening  Completed   HPV VACCINES  Aged Out   Meningococcal B Vaccine  Aged Out   - Declines pneumonia vaccine -Will consider shingles vaccine at some point this year -Plans to get flu vaccine later this fall  Social history-divorced.  89 year old son.  Works as a International aid/development worker.  Never smoked.  No regular alcohol.  Family history Brother with MI age 8.  Sister with hyperlipidemia and very high coronary calcium  score.  Paternal grandfather had MI 83s.  Her father has a pacemaker.  Father has prostate cancer.  Past Medical History:  Diagnosis Date   ABDOMINAL PAIN 05/05/2008   ABDOMINAL PAIN OTHER SPECIFIED SITE  03/23/2009   ALLERGIC  RHINITIS 08/21/2006   Allergy    Anxiety    CERVICAL LYMPHADENOPATHY 06/08/2009   DEPRESSION 08/21/2006   Diarrhea 03/23/2009   HEMATOCHEZIA 05/10/2010   HEMORRHOIDS 03/23/2009   HEMORRHOIDS-EXTERNAL 03/23/2009   Hyperlipidemia    INSOMNIA, TRANSIENT 07/26/2010   Past Surgical History:  Procedure Laterality Date   LEEP     LEEP      reports that she has never smoked. She has never used smokeless tobacco. She reports current alcohol use. She reports that she does not use drugs. family history includes Cancer in an other family member; Depression in her brother and sister; Heart disease in an other family member. Allergies  Allergen Reactions   Codeine Nausea And Vomiting     Review of Systems  Constitutional:  Negative for chills, fever, malaise/fatigue and weight loss.  HENT:  Negative for hearing loss.   Eyes:  Negative for blurred vision and double vision.  Respiratory:  Negative for cough and shortness of breath.   Cardiovascular:  Negative for chest pain, palpitations and leg swelling.  Gastrointestinal:  Negative for abdominal pain, blood in stool, constipation and diarrhea.  Genitourinary:  Negative for dysuria.  Skin:  Negative for rash.  Neurological:  Negative for dizziness, speech change, seizures, loss of consciousness and headaches.  Psychiatric/Behavioral:  Negative for depression.       Objective:     BP 130/70  Pulse 80   Temp 97.8 F (36.6 C) (Oral)   Ht 5' 5.35 (1.66 m)   Wt 133 lb (60.3 kg)   SpO2 98%   BMI 21.89 kg/m  BP Readings from Last 3 Encounters:  06/01/24 130/70  03/19/23 122/80  05/09/22 110/67   Wt Readings from Last 3 Encounters:  06/01/24 133 lb (60.3 kg)  03/19/23 141 lb 12.8 oz (64.3 kg)  05/09/22 140 lb (63.5 kg)      Physical Exam Vitals reviewed.  Constitutional:      General: She is not in acute distress.    Appearance: She is well-developed. She is not ill-appearing.  HENT:     Head:  Normocephalic and atraumatic.  Eyes:     Pupils: Pupils are equal, round, and reactive to light.  Neck:     Thyroid : No thyromegaly.  Cardiovascular:     Rate and Rhythm: Normal rate and regular rhythm.     Heart sounds: Normal heart sounds. No murmur heard. Pulmonary:     Effort: No respiratory distress.     Breath sounds: Normal breath sounds. No wheezing or rales.  Abdominal:     Palpations: There is no mass.  Musculoskeletal:        General: Normal range of motion.     Cervical back: Normal range of motion and neck supple.     Right lower leg: No edema.     Left lower leg: No edema.  Lymphadenopathy:     Cervical: No cervical adenopathy.  Skin:    Findings: No rash.  Neurological:     Mental Status: She is alert and oriented to person, place, and time.     Cranial Nerves: No cranial nerve deficit.  Psychiatric:        Behavior: Behavior normal.        Thought Content: Thought content normal.        Judgment: Judgment normal.      No results found for any visits on 06/01/24.  Last CBC Lab Results  Component Value Date   WBC 4.9 03/19/2023   HGB 13.0 03/19/2023   HCT 39.6 03/19/2023   MCV 88.7 03/19/2023   MCH 29.6 09/26/2020   RDW 15.2 03/19/2023   PLT 215.0 03/19/2023   Last metabolic panel Lab Results  Component Value Date   GLUCOSE 83 03/19/2023   NA 140 03/19/2023   K 3.8 03/19/2023   CL 105 03/19/2023   CO2 26 03/19/2023   BUN 13 03/19/2023   CREATININE 0.89 03/19/2023   GFR 74.72 03/19/2023   CALCIUM  9.7 03/19/2023   PROT 7.3 08/19/2023   ALBUMIN 4.6 08/19/2023   BILITOT 0.5 08/19/2023   ALKPHOS 72 08/19/2023   AST 17 08/19/2023   ALT 13 08/19/2023   Last lipids Lab Results  Component Value Date   CHOL 150 08/19/2023   HDL 76.20 08/19/2023   LDLCALC 62 08/19/2023   TRIG 60.0 08/19/2023   CHOLHDL 2 08/19/2023   Last hemoglobin A1c No results found for: HGBA1C Last thyroid  functions Lab Results  Component Value Date   TSH 1.48  03/19/2023      The 10-year ASCVD risk score (Arnett DK, et al., 2019) is: 0.9%    Assessment & Plan:   Problem List Items Addressed This Visit   None Visit Diagnoses       Physical exam    -  Primary   Relevant Orders   Basic metabolic panel with GFR   Lipid panel  CBC with Differential/Platelet   Hepatic function panel     Screen for STD (sexually transmitted disease)       Relevant Orders   HIV Antibody (routine testing w rflx)   RPR   Hep B Surface Antigen     Healthy 53 year old female.  History of hyperlipidemia and positive family history of premature CAD.  She is requesting STD screening, though asymptomatic.  Obtain screening labs as above.  We discussed the following health maintenance items  -Recommend annual flu vaccine which she plans to get later this fall - Consider Shingrix vaccine at some point this year - Discussed Prevnar 20 and she declines at this time - She plans to continue GYN follow-up for Pap smears and mammogram. - Continue regular dermatology screening for skin cancer - Refilled rosuvastatin  for 1 year.  Goal LDL less than 70 - We did discuss possible coronary calcium  scan but she is not interested at this time.  No follow-ups on file.    Wolm Scarlet, MD

## 2024-06-02 LAB — RPR: RPR Ser Ql: NONREACTIVE

## 2024-06-02 LAB — CERVICOVAGINAL ANCILLARY ONLY
Chlamydia: NEGATIVE
Comment: NEGATIVE
Comment: NEGATIVE
Comment: NORMAL
Neisseria Gonorrhea: NEGATIVE
Trichomonas: NEGATIVE

## 2024-06-02 LAB — HIV ANTIBODY (ROUTINE TESTING W REFLEX): HIV 1&2 Ab, 4th Generation: NONREACTIVE

## 2024-06-02 LAB — HEPATITIS B SURFACE ANTIGEN: Hepatitis B Surface Ag: NONREACTIVE

## 2024-07-12 DIAGNOSIS — Z23 Encounter for immunization: Secondary | ICD-10-CM | POA: Diagnosis not present

## 2024-07-27 DIAGNOSIS — N951 Menopausal and female climacteric states: Secondary | ICD-10-CM | POA: Diagnosis not present

## 2024-08-27 ENCOUNTER — Ambulatory Visit: Admitting: Physician Assistant
